# Patient Record
Sex: Male | Born: 1950
Health system: Southern US, Community
[De-identification: ages and names within clinical notes are randomized; demographics above are authoritative.]

## PROBLEM LIST (undated history)

## (undated) DIAGNOSIS — G4733 Obstructive sleep apnea (adult) (pediatric): Secondary | ICD-10-CM

## (undated) DIAGNOSIS — G43909 Migraine, unspecified, not intractable, without status migrainosus: Secondary | ICD-10-CM

## (undated) DIAGNOSIS — I10 Essential (primary) hypertension: Secondary | ICD-10-CM

## (undated) DIAGNOSIS — E119 Type 2 diabetes mellitus without complications: Secondary | ICD-10-CM

## (undated) HISTORY — PX: OTHER SURGICAL HISTORY: SHX169

## (undated) HISTORY — PX: MOHS SURGERY: SUR867

## (undated) HISTORY — PX: CATARACT EXTRACTION: SUR2

## (undated) HISTORY — PX: TONSILLECTOMY: SUR1361

## (undated) HISTORY — DX: Essential (primary) hypertension: I10

## (undated) HISTORY — DX: Migraine, unspecified, not intractable, without status migrainosus: G43.909

## (undated) HISTORY — DX: Type 2 diabetes mellitus without complications: E11.9

## (undated) HISTORY — DX: Obstructive sleep apnea (adult) (pediatric): G47.33

---

## 2016-06-13 DIAGNOSIS — I10 Essential (primary) hypertension: Secondary | ICD-10-CM | POA: Diagnosis not present

## 2016-06-13 DIAGNOSIS — D649 Anemia, unspecified: Secondary | ICD-10-CM | POA: Diagnosis not present

## 2016-06-18 DIAGNOSIS — E559 Vitamin D deficiency, unspecified: Secondary | ICD-10-CM | POA: Diagnosis not present

## 2016-06-20 DIAGNOSIS — R809 Proteinuria, unspecified: Secondary | ICD-10-CM | POA: Diagnosis not present

## 2016-06-20 DIAGNOSIS — E119 Type 2 diabetes mellitus without complications: Secondary | ICD-10-CM | POA: Diagnosis not present

## 2016-06-20 DIAGNOSIS — D649 Anemia, unspecified: Secondary | ICD-10-CM | POA: Diagnosis not present

## 2016-06-20 DIAGNOSIS — I10 Essential (primary) hypertension: Secondary | ICD-10-CM | POA: Diagnosis not present

## 2016-06-20 DIAGNOSIS — E669 Obesity, unspecified: Secondary | ICD-10-CM | POA: Diagnosis not present

## 2016-06-20 DIAGNOSIS — G4733 Obstructive sleep apnea (adult) (pediatric): Secondary | ICD-10-CM | POA: Diagnosis not present

## 2016-07-06 DIAGNOSIS — G4733 Obstructive sleep apnea (adult) (pediatric): Secondary | ICD-10-CM | POA: Diagnosis not present

## 2016-09-24 DIAGNOSIS — M109 Gout, unspecified: Secondary | ICD-10-CM | POA: Diagnosis not present

## 2016-09-24 DIAGNOSIS — E7800 Pure hypercholesterolemia, unspecified: Secondary | ICD-10-CM | POA: Diagnosis not present

## 2016-09-24 DIAGNOSIS — E559 Vitamin D deficiency, unspecified: Secondary | ICD-10-CM | POA: Diagnosis not present

## 2016-09-24 DIAGNOSIS — J029 Acute pharyngitis, unspecified: Secondary | ICD-10-CM | POA: Diagnosis not present

## 2016-09-24 DIAGNOSIS — E1165 Type 2 diabetes mellitus with hyperglycemia: Secondary | ICD-10-CM | POA: Diagnosis not present

## 2016-10-01 DIAGNOSIS — E119 Type 2 diabetes mellitus without complications: Secondary | ICD-10-CM | POA: Diagnosis not present

## 2016-10-01 DIAGNOSIS — G4730 Sleep apnea, unspecified: Secondary | ICD-10-CM | POA: Diagnosis not present

## 2016-10-01 DIAGNOSIS — E669 Obesity, unspecified: Secondary | ICD-10-CM | POA: Diagnosis not present

## 2016-10-01 DIAGNOSIS — K219 Gastro-esophageal reflux disease without esophagitis: Secondary | ICD-10-CM | POA: Diagnosis not present

## 2016-10-01 DIAGNOSIS — J309 Allergic rhinitis, unspecified: Secondary | ICD-10-CM | POA: Diagnosis not present

## 2016-10-01 DIAGNOSIS — Z9989 Dependence on other enabling machines and devices: Secondary | ICD-10-CM | POA: Diagnosis not present

## 2016-10-01 DIAGNOSIS — Z791 Long term (current) use of non-steroidal anti-inflammatories (NSAID): Secondary | ICD-10-CM | POA: Diagnosis not present

## 2016-10-01 DIAGNOSIS — Z7982 Long term (current) use of aspirin: Secondary | ICD-10-CM | POA: Diagnosis not present

## 2016-10-01 DIAGNOSIS — Z0000 Encounter for general adult medical examination without abnormal findings: Secondary | ICD-10-CM | POA: Diagnosis not present

## 2016-10-01 DIAGNOSIS — I10 Essential (primary) hypertension: Secondary | ICD-10-CM | POA: Diagnosis not present

## 2016-10-01 DIAGNOSIS — Z6839 Body mass index (BMI) 39.0-39.9, adult: Secondary | ICD-10-CM | POA: Diagnosis not present

## 2016-10-04 DIAGNOSIS — M65332 Trigger finger, left middle finger: Secondary | ICD-10-CM | POA: Diagnosis not present

## 2016-10-08 DIAGNOSIS — R69 Illness, unspecified: Secondary | ICD-10-CM | POA: Diagnosis not present

## 2016-10-22 DIAGNOSIS — L738 Other specified follicular disorders: Secondary | ICD-10-CM | POA: Diagnosis not present

## 2016-10-22 DIAGNOSIS — L570 Actinic keratosis: Secondary | ICD-10-CM | POA: Diagnosis not present

## 2016-10-24 DIAGNOSIS — Z800 Family history of malignant neoplasm of digestive organs: Secondary | ICD-10-CM | POA: Diagnosis not present

## 2016-10-24 DIAGNOSIS — Z86010 Personal history of colonic polyps: Secondary | ICD-10-CM | POA: Diagnosis not present

## 2016-11-07 DIAGNOSIS — R69 Illness, unspecified: Secondary | ICD-10-CM | POA: Diagnosis not present

## 2016-11-08 DIAGNOSIS — M65332 Trigger finger, left middle finger: Secondary | ICD-10-CM | POA: Diagnosis not present

## 2016-11-16 DIAGNOSIS — Z86010 Personal history of colonic polyps: Secondary | ICD-10-CM | POA: Diagnosis not present

## 2016-12-31 DIAGNOSIS — Z0101 Encounter for examination of eyes and vision with abnormal findings: Secondary | ICD-10-CM | POA: Diagnosis not present

## 2016-12-31 DIAGNOSIS — E119 Type 2 diabetes mellitus without complications: Secondary | ICD-10-CM | POA: Diagnosis not present

## 2016-12-31 DIAGNOSIS — H43819 Vitreous degeneration, unspecified eye: Secondary | ICD-10-CM | POA: Diagnosis not present

## 2017-01-03 DIAGNOSIS — D649 Anemia, unspecified: Secondary | ICD-10-CM | POA: Diagnosis not present

## 2017-01-03 DIAGNOSIS — I10 Essential (primary) hypertension: Secondary | ICD-10-CM | POA: Diagnosis not present

## 2017-01-04 DIAGNOSIS — Z23 Encounter for immunization: Secondary | ICD-10-CM | POA: Diagnosis not present

## 2017-01-04 DIAGNOSIS — J45901 Unspecified asthma with (acute) exacerbation: Secondary | ICD-10-CM | POA: Diagnosis not present

## 2017-01-04 DIAGNOSIS — I10 Essential (primary) hypertension: Secondary | ICD-10-CM | POA: Diagnosis not present

## 2017-01-04 DIAGNOSIS — E559 Vitamin D deficiency, unspecified: Secondary | ICD-10-CM | POA: Diagnosis not present

## 2017-01-04 DIAGNOSIS — G4733 Obstructive sleep apnea (adult) (pediatric): Secondary | ICD-10-CM | POA: Diagnosis not present

## 2017-01-04 DIAGNOSIS — E119 Type 2 diabetes mellitus without complications: Secondary | ICD-10-CM | POA: Diagnosis not present

## 2017-01-10 DIAGNOSIS — E1165 Type 2 diabetes mellitus with hyperglycemia: Secondary | ICD-10-CM | POA: Diagnosis not present

## 2017-01-18 DIAGNOSIS — E1165 Type 2 diabetes mellitus with hyperglycemia: Secondary | ICD-10-CM | POA: Diagnosis not present

## 2017-01-18 DIAGNOSIS — I10 Essential (primary) hypertension: Secondary | ICD-10-CM | POA: Diagnosis not present

## 2017-01-18 DIAGNOSIS — Z6838 Body mass index (BMI) 38.0-38.9, adult: Secondary | ICD-10-CM | POA: Diagnosis not present

## 2017-01-31 DIAGNOSIS — E1165 Type 2 diabetes mellitus with hyperglycemia: Secondary | ICD-10-CM | POA: Diagnosis not present

## 2017-01-31 DIAGNOSIS — R69 Illness, unspecified: Secondary | ICD-10-CM | POA: Diagnosis not present

## 2017-01-31 DIAGNOSIS — Z6838 Body mass index (BMI) 38.0-38.9, adult: Secondary | ICD-10-CM | POA: Diagnosis not present

## 2017-01-31 DIAGNOSIS — I10 Essential (primary) hypertension: Secondary | ICD-10-CM | POA: Diagnosis not present

## 2017-03-04 DIAGNOSIS — R69 Illness, unspecified: Secondary | ICD-10-CM | POA: Diagnosis not present

## 2017-03-11 DIAGNOSIS — I10 Essential (primary) hypertension: Secondary | ICD-10-CM | POA: Diagnosis not present

## 2017-03-11 DIAGNOSIS — D649 Anemia, unspecified: Secondary | ICD-10-CM | POA: Diagnosis not present

## 2017-03-14 DIAGNOSIS — E782 Mixed hyperlipidemia: Secondary | ICD-10-CM | POA: Diagnosis not present

## 2017-03-14 DIAGNOSIS — Z23 Encounter for immunization: Secondary | ICD-10-CM | POA: Diagnosis not present

## 2017-03-14 DIAGNOSIS — N289 Disorder of kidney and ureter, unspecified: Secondary | ICD-10-CM | POA: Diagnosis not present

## 2017-03-14 DIAGNOSIS — I10 Essential (primary) hypertension: Secondary | ICD-10-CM | POA: Diagnosis not present

## 2017-03-14 DIAGNOSIS — E1165 Type 2 diabetes mellitus with hyperglycemia: Secondary | ICD-10-CM | POA: Diagnosis not present

## 2017-04-03 DIAGNOSIS — B351 Tinea unguium: Secondary | ICD-10-CM | POA: Diagnosis not present

## 2017-04-03 DIAGNOSIS — M79671 Pain in right foot: Secondary | ICD-10-CM | POA: Diagnosis not present

## 2017-04-03 DIAGNOSIS — E119 Type 2 diabetes mellitus without complications: Secondary | ICD-10-CM | POA: Diagnosis not present

## 2017-04-07 DIAGNOSIS — R69 Illness, unspecified: Secondary | ICD-10-CM | POA: Diagnosis not present

## 2017-04-17 DIAGNOSIS — I10 Essential (primary) hypertension: Secondary | ICD-10-CM | POA: Diagnosis not present

## 2017-04-17 DIAGNOSIS — Z6837 Body mass index (BMI) 37.0-37.9, adult: Secondary | ICD-10-CM | POA: Diagnosis not present

## 2017-04-17 DIAGNOSIS — E782 Mixed hyperlipidemia: Secondary | ICD-10-CM | POA: Diagnosis not present

## 2017-04-17 DIAGNOSIS — E1165 Type 2 diabetes mellitus with hyperglycemia: Secondary | ICD-10-CM | POA: Diagnosis not present

## 2017-04-18 DIAGNOSIS — I10 Essential (primary) hypertension: Secondary | ICD-10-CM | POA: Diagnosis not present

## 2017-04-18 DIAGNOSIS — E669 Obesity, unspecified: Secondary | ICD-10-CM | POA: Diagnosis not present

## 2017-04-18 DIAGNOSIS — E119 Type 2 diabetes mellitus without complications: Secondary | ICD-10-CM | POA: Diagnosis not present

## 2017-04-18 DIAGNOSIS — D649 Anemia, unspecified: Secondary | ICD-10-CM | POA: Diagnosis not present

## 2017-04-18 DIAGNOSIS — G4733 Obstructive sleep apnea (adult) (pediatric): Secondary | ICD-10-CM | POA: Diagnosis not present

## 2017-04-18 DIAGNOSIS — R809 Proteinuria, unspecified: Secondary | ICD-10-CM | POA: Diagnosis not present

## 2017-04-29 DIAGNOSIS — L400 Psoriasis vulgaris: Secondary | ICD-10-CM | POA: Diagnosis not present

## 2017-04-29 DIAGNOSIS — Z08 Encounter for follow-up examination after completed treatment for malignant neoplasm: Secondary | ICD-10-CM | POA: Diagnosis not present

## 2017-04-29 DIAGNOSIS — Z85828 Personal history of other malignant neoplasm of skin: Secondary | ICD-10-CM | POA: Diagnosis not present

## 2017-04-29 DIAGNOSIS — L570 Actinic keratosis: Secondary | ICD-10-CM | POA: Diagnosis not present

## 2017-04-29 DIAGNOSIS — C4441 Basal cell carcinoma of skin of scalp and neck: Secondary | ICD-10-CM | POA: Diagnosis not present

## 2017-04-29 DIAGNOSIS — D485 Neoplasm of uncertain behavior of skin: Secondary | ICD-10-CM | POA: Diagnosis not present

## 2017-05-15 DIAGNOSIS — R69 Illness, unspecified: Secondary | ICD-10-CM | POA: Diagnosis not present

## 2017-05-17 DIAGNOSIS — R69 Illness, unspecified: Secondary | ICD-10-CM | POA: Diagnosis not present

## 2017-06-28 DIAGNOSIS — D2262 Melanocytic nevi of left upper limb, including shoulder: Secondary | ICD-10-CM | POA: Diagnosis not present

## 2017-06-28 DIAGNOSIS — Z08 Encounter for follow-up examination after completed treatment for malignant neoplasm: Secondary | ICD-10-CM | POA: Diagnosis not present

## 2017-06-28 DIAGNOSIS — Z85828 Personal history of other malignant neoplasm of skin: Secondary | ICD-10-CM | POA: Diagnosis not present

## 2017-06-28 DIAGNOSIS — C4441 Basal cell carcinoma of skin of scalp and neck: Secondary | ICD-10-CM | POA: Diagnosis not present

## 2017-07-02 DIAGNOSIS — R69 Illness, unspecified: Secondary | ICD-10-CM | POA: Diagnosis not present

## 2017-07-04 DIAGNOSIS — R69 Illness, unspecified: Secondary | ICD-10-CM | POA: Diagnosis not present

## 2017-07-08 DIAGNOSIS — G4733 Obstructive sleep apnea (adult) (pediatric): Secondary | ICD-10-CM | POA: Diagnosis not present

## 2017-07-24 DIAGNOSIS — Z6837 Body mass index (BMI) 37.0-37.9, adult: Secondary | ICD-10-CM | POA: Diagnosis not present

## 2017-07-24 DIAGNOSIS — E1165 Type 2 diabetes mellitus with hyperglycemia: Secondary | ICD-10-CM | POA: Diagnosis not present

## 2017-07-24 DIAGNOSIS — I10 Essential (primary) hypertension: Secondary | ICD-10-CM | POA: Diagnosis not present

## 2017-07-24 DIAGNOSIS — E782 Mixed hyperlipidemia: Secondary | ICD-10-CM | POA: Diagnosis not present

## 2017-10-03 DIAGNOSIS — R69 Illness, unspecified: Secondary | ICD-10-CM | POA: Diagnosis not present

## 2017-10-22 DIAGNOSIS — N289 Disorder of kidney and ureter, unspecified: Secondary | ICD-10-CM | POA: Diagnosis not present

## 2017-10-22 DIAGNOSIS — I10 Essential (primary) hypertension: Secondary | ICD-10-CM | POA: Diagnosis not present

## 2017-10-22 DIAGNOSIS — E782 Mixed hyperlipidemia: Secondary | ICD-10-CM | POA: Diagnosis not present

## 2017-10-22 DIAGNOSIS — E1165 Type 2 diabetes mellitus with hyperglycemia: Secondary | ICD-10-CM | POA: Diagnosis not present

## 2017-10-24 DIAGNOSIS — E782 Mixed hyperlipidemia: Secondary | ICD-10-CM | POA: Diagnosis not present

## 2017-10-24 DIAGNOSIS — Z6838 Body mass index (BMI) 38.0-38.9, adult: Secondary | ICD-10-CM | POA: Diagnosis not present

## 2017-10-24 DIAGNOSIS — E1165 Type 2 diabetes mellitus with hyperglycemia: Secondary | ICD-10-CM | POA: Diagnosis not present

## 2017-10-24 DIAGNOSIS — I10 Essential (primary) hypertension: Secondary | ICD-10-CM | POA: Diagnosis not present

## 2017-10-24 DIAGNOSIS — M549 Dorsalgia, unspecified: Secondary | ICD-10-CM | POA: Diagnosis not present

## 2017-10-24 DIAGNOSIS — N289 Disorder of kidney and ureter, unspecified: Secondary | ICD-10-CM | POA: Diagnosis not present

## 2017-10-28 DIAGNOSIS — L821 Other seborrheic keratosis: Secondary | ICD-10-CM | POA: Diagnosis not present

## 2017-10-28 DIAGNOSIS — D1801 Hemangioma of skin and subcutaneous tissue: Secondary | ICD-10-CM | POA: Diagnosis not present

## 2017-10-28 DIAGNOSIS — Z08 Encounter for follow-up examination after completed treatment for malignant neoplasm: Secondary | ICD-10-CM | POA: Diagnosis not present

## 2017-10-28 DIAGNOSIS — L570 Actinic keratosis: Secondary | ICD-10-CM | POA: Diagnosis not present

## 2017-10-28 DIAGNOSIS — X32XXXS Exposure to sunlight, sequela: Secondary | ICD-10-CM | POA: Diagnosis not present

## 2017-10-28 DIAGNOSIS — Z85828 Personal history of other malignant neoplasm of skin: Secondary | ICD-10-CM | POA: Diagnosis not present

## 2017-10-28 DIAGNOSIS — L814 Other melanin hyperpigmentation: Secondary | ICD-10-CM | POA: Diagnosis not present

## 2017-11-20 DIAGNOSIS — R69 Illness, unspecified: Secondary | ICD-10-CM | POA: Diagnosis not present

## 2017-11-25 DIAGNOSIS — G4733 Obstructive sleep apnea (adult) (pediatric): Secondary | ICD-10-CM | POA: Diagnosis not present

## 2017-11-25 DIAGNOSIS — E1165 Type 2 diabetes mellitus with hyperglycemia: Secondary | ICD-10-CM | POA: Diagnosis not present

## 2017-11-25 DIAGNOSIS — Z6838 Body mass index (BMI) 38.0-38.9, adult: Secondary | ICD-10-CM | POA: Diagnosis not present

## 2018-01-06 DIAGNOSIS — G4733 Obstructive sleep apnea (adult) (pediatric): Secondary | ICD-10-CM | POA: Diagnosis not present

## 2018-01-21 DIAGNOSIS — E1165 Type 2 diabetes mellitus with hyperglycemia: Secondary | ICD-10-CM | POA: Diagnosis not present

## 2018-01-21 DIAGNOSIS — Z125 Encounter for screening for malignant neoplasm of prostate: Secondary | ICD-10-CM | POA: Diagnosis not present

## 2018-01-21 DIAGNOSIS — Z0000 Encounter for general adult medical examination without abnormal findings: Secondary | ICD-10-CM | POA: Diagnosis not present

## 2018-01-23 DIAGNOSIS — E1165 Type 2 diabetes mellitus with hyperglycemia: Secondary | ICD-10-CM | POA: Diagnosis not present

## 2018-01-23 DIAGNOSIS — E782 Mixed hyperlipidemia: Secondary | ICD-10-CM | POA: Diagnosis not present

## 2018-01-23 DIAGNOSIS — Z1211 Encounter for screening for malignant neoplasm of colon: Secondary | ICD-10-CM | POA: Diagnosis not present

## 2018-01-23 DIAGNOSIS — Z0000 Encounter for general adult medical examination without abnormal findings: Secondary | ICD-10-CM | POA: Diagnosis not present

## 2018-01-23 DIAGNOSIS — Z23 Encounter for immunization: Secondary | ICD-10-CM | POA: Diagnosis not present

## 2018-01-23 DIAGNOSIS — Z6838 Body mass index (BMI) 38.0-38.9, adult: Secondary | ICD-10-CM | POA: Diagnosis not present

## 2018-01-23 DIAGNOSIS — I10 Essential (primary) hypertension: Secondary | ICD-10-CM | POA: Diagnosis not present

## 2018-02-09 DIAGNOSIS — M109 Gout, unspecified: Secondary | ICD-10-CM | POA: Diagnosis not present

## 2018-02-11 DIAGNOSIS — E119 Type 2 diabetes mellitus without complications: Secondary | ICD-10-CM | POA: Diagnosis not present

## 2018-02-11 DIAGNOSIS — H43819 Vitreous degeneration, unspecified eye: Secondary | ICD-10-CM | POA: Diagnosis not present

## 2018-02-12 DIAGNOSIS — M1A0710 Idiopathic chronic gout, right ankle and foot, without tophus (tophi): Secondary | ICD-10-CM | POA: Diagnosis not present

## 2018-02-12 DIAGNOSIS — E119 Type 2 diabetes mellitus without complications: Secondary | ICD-10-CM | POA: Diagnosis not present

## 2018-02-12 DIAGNOSIS — M79672 Pain in left foot: Secondary | ICD-10-CM | POA: Diagnosis not present

## 2018-02-12 DIAGNOSIS — M79671 Pain in right foot: Secondary | ICD-10-CM | POA: Diagnosis not present

## 2018-02-12 DIAGNOSIS — B351 Tinea unguium: Secondary | ICD-10-CM | POA: Diagnosis not present

## 2018-02-17 DIAGNOSIS — R69 Illness, unspecified: Secondary | ICD-10-CM | POA: Diagnosis not present

## 2018-04-10 DIAGNOSIS — M7051 Other bursitis of knee, right knee: Secondary | ICD-10-CM | POA: Diagnosis not present

## 2018-04-23 DIAGNOSIS — Z85828 Personal history of other malignant neoplasm of skin: Secondary | ICD-10-CM | POA: Diagnosis not present

## 2018-04-23 DIAGNOSIS — C4441 Basal cell carcinoma of skin of scalp and neck: Secondary | ICD-10-CM | POA: Diagnosis not present

## 2018-04-23 DIAGNOSIS — D485 Neoplasm of uncertain behavior of skin: Secondary | ICD-10-CM | POA: Diagnosis not present

## 2018-04-23 DIAGNOSIS — L821 Other seborrheic keratosis: Secondary | ICD-10-CM | POA: Diagnosis not present

## 2018-04-24 DIAGNOSIS — M7051 Other bursitis of knee, right knee: Secondary | ICD-10-CM | POA: Diagnosis not present

## 2018-05-14 DIAGNOSIS — M79672 Pain in left foot: Secondary | ICD-10-CM | POA: Diagnosis not present

## 2018-05-14 DIAGNOSIS — B351 Tinea unguium: Secondary | ICD-10-CM | POA: Diagnosis not present

## 2018-05-14 DIAGNOSIS — Z5181 Encounter for therapeutic drug level monitoring: Secondary | ICD-10-CM | POA: Diagnosis not present

## 2018-05-14 DIAGNOSIS — M79671 Pain in right foot: Secondary | ICD-10-CM | POA: Diagnosis not present

## 2018-05-15 DIAGNOSIS — M7051 Other bursitis of knee, right knee: Secondary | ICD-10-CM | POA: Diagnosis not present

## 2018-05-17 DIAGNOSIS — J019 Acute sinusitis, unspecified: Secondary | ICD-10-CM | POA: Diagnosis not present

## 2018-05-19 DIAGNOSIS — R69 Illness, unspecified: Secondary | ICD-10-CM | POA: Diagnosis not present

## 2018-05-21 DIAGNOSIS — R69 Illness, unspecified: Secondary | ICD-10-CM | POA: Diagnosis not present

## 2018-05-26 DIAGNOSIS — E782 Mixed hyperlipidemia: Secondary | ICD-10-CM | POA: Diagnosis not present

## 2018-05-26 DIAGNOSIS — I10 Essential (primary) hypertension: Secondary | ICD-10-CM | POA: Diagnosis not present

## 2018-05-26 DIAGNOSIS — E559 Vitamin D deficiency, unspecified: Secondary | ICD-10-CM | POA: Diagnosis not present

## 2018-05-26 DIAGNOSIS — E1165 Type 2 diabetes mellitus with hyperglycemia: Secondary | ICD-10-CM | POA: Diagnosis not present

## 2018-05-27 DIAGNOSIS — E782 Mixed hyperlipidemia: Secondary | ICD-10-CM | POA: Diagnosis not present

## 2018-05-27 DIAGNOSIS — I10 Essential (primary) hypertension: Secondary | ICD-10-CM | POA: Diagnosis not present

## 2018-05-27 DIAGNOSIS — Z6838 Body mass index (BMI) 38.0-38.9, adult: Secondary | ICD-10-CM | POA: Diagnosis not present

## 2018-05-27 DIAGNOSIS — E559 Vitamin D deficiency, unspecified: Secondary | ICD-10-CM | POA: Diagnosis not present

## 2018-05-27 DIAGNOSIS — M109 Gout, unspecified: Secondary | ICD-10-CM | POA: Diagnosis not present

## 2018-05-27 DIAGNOSIS — E1165 Type 2 diabetes mellitus with hyperglycemia: Secondary | ICD-10-CM | POA: Diagnosis not present

## 2018-06-09 DIAGNOSIS — R69 Illness, unspecified: Secondary | ICD-10-CM | POA: Diagnosis not present

## 2018-07-08 DIAGNOSIS — E1165 Type 2 diabetes mellitus with hyperglycemia: Secondary | ICD-10-CM | POA: Diagnosis not present

## 2018-07-08 DIAGNOSIS — M109 Gout, unspecified: Secondary | ICD-10-CM | POA: Diagnosis not present

## 2018-07-08 DIAGNOSIS — Z6838 Body mass index (BMI) 38.0-38.9, adult: Secondary | ICD-10-CM | POA: Diagnosis not present

## 2018-07-08 DIAGNOSIS — I10 Essential (primary) hypertension: Secondary | ICD-10-CM | POA: Diagnosis not present

## 2018-07-08 DIAGNOSIS — E782 Mixed hyperlipidemia: Secondary | ICD-10-CM | POA: Diagnosis not present

## 2018-07-09 DIAGNOSIS — G4733 Obstructive sleep apnea (adult) (pediatric): Secondary | ICD-10-CM | POA: Diagnosis not present

## 2018-07-14 DIAGNOSIS — R69 Illness, unspecified: Secondary | ICD-10-CM | POA: Diagnosis not present

## 2018-07-30 DIAGNOSIS — C4441 Basal cell carcinoma of skin of scalp and neck: Secondary | ICD-10-CM | POA: Diagnosis not present

## 2018-09-02 DIAGNOSIS — I10 Essential (primary) hypertension: Secondary | ICD-10-CM | POA: Diagnosis not present

## 2018-09-02 DIAGNOSIS — E1165 Type 2 diabetes mellitus with hyperglycemia: Secondary | ICD-10-CM | POA: Diagnosis not present

## 2018-09-02 DIAGNOSIS — G4733 Obstructive sleep apnea (adult) (pediatric): Secondary | ICD-10-CM | POA: Diagnosis not present

## 2018-10-15 DIAGNOSIS — Z85828 Personal history of other malignant neoplasm of skin: Secondary | ICD-10-CM | POA: Diagnosis not present

## 2018-10-15 DIAGNOSIS — Z08 Encounter for follow-up examination after completed treatment for malignant neoplasm: Secondary | ICD-10-CM | POA: Diagnosis not present

## 2018-10-21 DIAGNOSIS — R69 Illness, unspecified: Secondary | ICD-10-CM | POA: Diagnosis not present

## 2018-10-31 DIAGNOSIS — E119 Type 2 diabetes mellitus without complications: Secondary | ICD-10-CM | POA: Diagnosis not present

## 2018-11-04 DIAGNOSIS — I10 Essential (primary) hypertension: Secondary | ICD-10-CM | POA: Diagnosis not present

## 2018-11-04 DIAGNOSIS — E1165 Type 2 diabetes mellitus with hyperglycemia: Secondary | ICD-10-CM | POA: Diagnosis not present

## 2018-11-04 DIAGNOSIS — J309 Allergic rhinitis, unspecified: Secondary | ICD-10-CM | POA: Diagnosis not present

## 2018-11-05 DIAGNOSIS — B351 Tinea unguium: Secondary | ICD-10-CM | POA: Diagnosis not present

## 2018-11-05 DIAGNOSIS — M79672 Pain in left foot: Secondary | ICD-10-CM | POA: Diagnosis not present

## 2018-11-05 DIAGNOSIS — M79671 Pain in right foot: Secondary | ICD-10-CM | POA: Diagnosis not present

## 2019-01-01 DIAGNOSIS — D3132 Benign neoplasm of left choroid: Secondary | ICD-10-CM | POA: Diagnosis not present

## 2019-01-01 DIAGNOSIS — G43801 Other migraine, not intractable, with status migrainosus: Secondary | ICD-10-CM | POA: Diagnosis not present

## 2019-01-07 DIAGNOSIS — G4733 Obstructive sleep apnea (adult) (pediatric): Secondary | ICD-10-CM | POA: Diagnosis not present

## 2019-01-17 DIAGNOSIS — R69 Illness, unspecified: Secondary | ICD-10-CM | POA: Diagnosis not present

## 2019-01-29 DIAGNOSIS — E782 Mixed hyperlipidemia: Secondary | ICD-10-CM | POA: Diagnosis not present

## 2019-01-29 DIAGNOSIS — M109 Gout, unspecified: Secondary | ICD-10-CM | POA: Diagnosis not present

## 2019-01-29 DIAGNOSIS — I10 Essential (primary) hypertension: Secondary | ICD-10-CM | POA: Diagnosis not present

## 2019-01-29 DIAGNOSIS — E119 Type 2 diabetes mellitus without complications: Secondary | ICD-10-CM | POA: Diagnosis not present

## 2019-02-02 DIAGNOSIS — J309 Allergic rhinitis, unspecified: Secondary | ICD-10-CM | POA: Diagnosis not present

## 2019-02-02 DIAGNOSIS — E1165 Type 2 diabetes mellitus with hyperglycemia: Secondary | ICD-10-CM | POA: Diagnosis not present

## 2019-02-02 DIAGNOSIS — E782 Mixed hyperlipidemia: Secondary | ICD-10-CM | POA: Diagnosis not present

## 2019-02-02 DIAGNOSIS — M109 Gout, unspecified: Secondary | ICD-10-CM | POA: Diagnosis not present

## 2019-02-02 DIAGNOSIS — I10 Essential (primary) hypertension: Secondary | ICD-10-CM | POA: Diagnosis not present

## 2019-02-04 DIAGNOSIS — M79671 Pain in right foot: Secondary | ICD-10-CM | POA: Diagnosis not present

## 2019-02-04 DIAGNOSIS — M79672 Pain in left foot: Secondary | ICD-10-CM | POA: Diagnosis not present

## 2019-02-04 DIAGNOSIS — B351 Tinea unguium: Secondary | ICD-10-CM | POA: Diagnosis not present

## 2019-02-05 DIAGNOSIS — Z23 Encounter for immunization: Secondary | ICD-10-CM | POA: Diagnosis not present

## 2019-02-09 DIAGNOSIS — M79671 Pain in right foot: Secondary | ICD-10-CM | POA: Diagnosis not present

## 2019-02-09 DIAGNOSIS — M79672 Pain in left foot: Secondary | ICD-10-CM | POA: Diagnosis not present

## 2019-02-09 DIAGNOSIS — B351 Tinea unguium: Secondary | ICD-10-CM | POA: Diagnosis not present

## 2019-02-19 DIAGNOSIS — G43801 Other migraine, not intractable, with status migrainosus: Secondary | ICD-10-CM | POA: Diagnosis not present

## 2019-02-19 DIAGNOSIS — E1165 Type 2 diabetes mellitus with hyperglycemia: Secondary | ICD-10-CM | POA: Diagnosis not present

## 2019-02-19 DIAGNOSIS — D3132 Benign neoplasm of left choroid: Secondary | ICD-10-CM | POA: Diagnosis not present

## 2019-02-23 DIAGNOSIS — L821 Other seborrheic keratosis: Secondary | ICD-10-CM | POA: Diagnosis not present

## 2019-02-23 DIAGNOSIS — D225 Melanocytic nevi of trunk: Secondary | ICD-10-CM | POA: Diagnosis not present

## 2019-02-23 DIAGNOSIS — L570 Actinic keratosis: Secondary | ICD-10-CM | POA: Diagnosis not present

## 2019-02-23 DIAGNOSIS — D485 Neoplasm of uncertain behavior of skin: Secondary | ICD-10-CM | POA: Diagnosis not present

## 2019-02-23 DIAGNOSIS — Z85828 Personal history of other malignant neoplasm of skin: Secondary | ICD-10-CM | POA: Diagnosis not present

## 2019-02-23 DIAGNOSIS — L408 Other psoriasis: Secondary | ICD-10-CM | POA: Diagnosis not present

## 2019-04-28 DIAGNOSIS — Z1159 Encounter for screening for other viral diseases: Secondary | ICD-10-CM | POA: Diagnosis not present

## 2019-05-01 DIAGNOSIS — Z125 Encounter for screening for malignant neoplasm of prostate: Secondary | ICD-10-CM | POA: Diagnosis not present

## 2019-05-01 DIAGNOSIS — Z0000 Encounter for general adult medical examination without abnormal findings: Secondary | ICD-10-CM | POA: Diagnosis not present

## 2019-05-06 DIAGNOSIS — Z0000 Encounter for general adult medical examination without abnormal findings: Secondary | ICD-10-CM | POA: Diagnosis not present

## 2019-05-06 DIAGNOSIS — B351 Tinea unguium: Secondary | ICD-10-CM | POA: Diagnosis not present

## 2019-05-06 DIAGNOSIS — M79672 Pain in left foot: Secondary | ICD-10-CM | POA: Diagnosis not present

## 2019-05-06 DIAGNOSIS — E119 Type 2 diabetes mellitus without complications: Secondary | ICD-10-CM | POA: Diagnosis not present

## 2019-05-06 DIAGNOSIS — M79671 Pain in right foot: Secondary | ICD-10-CM | POA: Diagnosis not present

## 2019-05-06 DIAGNOSIS — Z6838 Body mass index (BMI) 38.0-38.9, adult: Secondary | ICD-10-CM | POA: Diagnosis not present

## 2019-06-01 DIAGNOSIS — S83241A Other tear of medial meniscus, current injury, right knee, initial encounter: Secondary | ICD-10-CM | POA: Diagnosis not present

## 2019-06-01 DIAGNOSIS — M25561 Pain in right knee: Secondary | ICD-10-CM | POA: Diagnosis not present

## 2019-06-01 DIAGNOSIS — M67911 Unspecified disorder of synovium and tendon, right shoulder: Secondary | ICD-10-CM | POA: Diagnosis not present

## 2019-06-02 DIAGNOSIS — R69 Illness, unspecified: Secondary | ICD-10-CM | POA: Diagnosis not present

## 2019-06-02 DIAGNOSIS — N289 Disorder of kidney and ureter, unspecified: Secondary | ICD-10-CM | POA: Diagnosis not present

## 2019-06-02 DIAGNOSIS — E1165 Type 2 diabetes mellitus with hyperglycemia: Secondary | ICD-10-CM | POA: Diagnosis not present

## 2019-06-02 DIAGNOSIS — R739 Hyperglycemia, unspecified: Secondary | ICD-10-CM | POA: Diagnosis not present

## 2019-06-04 DIAGNOSIS — R69 Illness, unspecified: Secondary | ICD-10-CM | POA: Diagnosis not present

## 2019-06-10 DIAGNOSIS — D485 Neoplasm of uncertain behavior of skin: Secondary | ICD-10-CM | POA: Diagnosis not present

## 2019-06-10 DIAGNOSIS — D229 Melanocytic nevi, unspecified: Secondary | ICD-10-CM | POA: Diagnosis not present

## 2019-06-10 DIAGNOSIS — Z85828 Personal history of other malignant neoplasm of skin: Secondary | ICD-10-CM | POA: Diagnosis not present

## 2019-06-10 DIAGNOSIS — C4441 Basal cell carcinoma of skin of scalp and neck: Secondary | ICD-10-CM | POA: Diagnosis not present

## 2019-06-10 DIAGNOSIS — L821 Other seborrheic keratosis: Secondary | ICD-10-CM | POA: Diagnosis not present

## 2019-06-16 DIAGNOSIS — E1165 Type 2 diabetes mellitus with hyperglycemia: Secondary | ICD-10-CM | POA: Diagnosis not present

## 2019-06-16 DIAGNOSIS — M170 Bilateral primary osteoarthritis of knee: Secondary | ICD-10-CM | POA: Diagnosis not present

## 2019-06-16 DIAGNOSIS — I10 Essential (primary) hypertension: Secondary | ICD-10-CM | POA: Diagnosis not present

## 2019-07-10 DIAGNOSIS — G4733 Obstructive sleep apnea (adult) (pediatric): Secondary | ICD-10-CM | POA: Diagnosis not present

## 2019-07-13 DIAGNOSIS — L989 Disorder of the skin and subcutaneous tissue, unspecified: Secondary | ICD-10-CM | POA: Diagnosis not present

## 2019-07-13 DIAGNOSIS — D485 Neoplasm of uncertain behavior of skin: Secondary | ICD-10-CM | POA: Diagnosis not present

## 2019-07-21 DIAGNOSIS — I10 Essential (primary) hypertension: Secondary | ICD-10-CM | POA: Diagnosis not present

## 2019-07-21 DIAGNOSIS — E782 Mixed hyperlipidemia: Secondary | ICD-10-CM | POA: Diagnosis not present

## 2019-07-21 DIAGNOSIS — E119 Type 2 diabetes mellitus without complications: Secondary | ICD-10-CM | POA: Diagnosis not present

## 2019-07-21 DIAGNOSIS — M109 Gout, unspecified: Secondary | ICD-10-CM | POA: Diagnosis not present

## 2019-07-22 DIAGNOSIS — E1165 Type 2 diabetes mellitus with hyperglycemia: Secondary | ICD-10-CM | POA: Diagnosis not present

## 2019-07-22 DIAGNOSIS — E782 Mixed hyperlipidemia: Secondary | ICD-10-CM | POA: Diagnosis not present

## 2019-07-22 DIAGNOSIS — I10 Essential (primary) hypertension: Secondary | ICD-10-CM | POA: Diagnosis not present

## 2019-08-04 DIAGNOSIS — M25462 Effusion, left knee: Secondary | ICD-10-CM | POA: Diagnosis not present

## 2019-08-04 DIAGNOSIS — M1712 Unilateral primary osteoarthritis, left knee: Secondary | ICD-10-CM | POA: Diagnosis not present

## 2019-08-04 DIAGNOSIS — M25562 Pain in left knee: Secondary | ICD-10-CM | POA: Diagnosis not present

## 2019-08-05 DIAGNOSIS — B351 Tinea unguium: Secondary | ICD-10-CM | POA: Diagnosis not present

## 2019-08-05 DIAGNOSIS — E119 Type 2 diabetes mellitus without complications: Secondary | ICD-10-CM | POA: Diagnosis not present

## 2019-08-05 DIAGNOSIS — M79671 Pain in right foot: Secondary | ICD-10-CM | POA: Diagnosis not present

## 2019-08-05 DIAGNOSIS — M79672 Pain in left foot: Secondary | ICD-10-CM | POA: Diagnosis not present

## 2019-08-12 DIAGNOSIS — I10 Essential (primary) hypertension: Secondary | ICD-10-CM | POA: Diagnosis not present

## 2019-08-12 DIAGNOSIS — E1165 Type 2 diabetes mellitus with hyperglycemia: Secondary | ICD-10-CM | POA: Diagnosis not present

## 2019-08-20 DIAGNOSIS — M25562 Pain in left knee: Secondary | ICD-10-CM | POA: Diagnosis not present

## 2019-08-28 ENCOUNTER — Other Ambulatory Visit: Payer: Self-pay | Admitting: General Practice

## 2019-08-28 NOTE — Patient Outreach (Signed)
Client is newly enrolled in the Special Needs Plan program with Type II Diabetes. Recent Hgb A1C is 6.9 % at goal. No Health Risk Assessment on file, Individualized Care Plan (ICP) completed from information in the EMR. Will send introductory letter with ICP to the primary provider and client, along with educational materials. No ED or acute inpatient visits in the past year, assigned RN Care Coordinator will follow up in 3 months.

## 2019-09-10 DIAGNOSIS — M25562 Pain in left knee: Secondary | ICD-10-CM | POA: Diagnosis not present

## 2019-09-16 DIAGNOSIS — C4441 Basal cell carcinoma of skin of scalp and neck: Secondary | ICD-10-CM | POA: Diagnosis not present

## 2019-09-21 DIAGNOSIS — L7621 Postprocedural hemorrhage and hematoma of skin and subcutaneous tissue following a dermatologic procedure: Secondary | ICD-10-CM | POA: Diagnosis not present

## 2019-09-21 DIAGNOSIS — S0100XD Unspecified open wound of scalp, subsequent encounter: Secondary | ICD-10-CM | POA: Diagnosis not present

## 2019-09-22 DIAGNOSIS — R600 Localized edema: Secondary | ICD-10-CM | POA: Diagnosis not present

## 2019-09-22 DIAGNOSIS — M170 Bilateral primary osteoarthritis of knee: Secondary | ICD-10-CM | POA: Diagnosis not present

## 2019-09-22 DIAGNOSIS — E1165 Type 2 diabetes mellitus with hyperglycemia: Secondary | ICD-10-CM | POA: Diagnosis not present

## 2019-09-22 DIAGNOSIS — I10 Essential (primary) hypertension: Secondary | ICD-10-CM | POA: Diagnosis not present

## 2019-09-24 DIAGNOSIS — C4441 Basal cell carcinoma of skin of scalp and neck: Secondary | ICD-10-CM | POA: Diagnosis not present

## 2019-09-24 DIAGNOSIS — Z85828 Personal history of other malignant neoplasm of skin: Secondary | ICD-10-CM | POA: Diagnosis not present

## 2019-09-24 DIAGNOSIS — S0100XA Unspecified open wound of scalp, initial encounter: Secondary | ICD-10-CM | POA: Diagnosis not present

## 2019-09-28 DIAGNOSIS — R600 Localized edema: Secondary | ICD-10-CM | POA: Diagnosis not present

## 2019-09-28 DIAGNOSIS — I10 Essential (primary) hypertension: Secondary | ICD-10-CM | POA: Diagnosis not present

## 2019-09-30 DIAGNOSIS — E1165 Type 2 diabetes mellitus with hyperglycemia: Secondary | ICD-10-CM | POA: Diagnosis not present

## 2019-09-30 DIAGNOSIS — R600 Localized edema: Secondary | ICD-10-CM | POA: Diagnosis not present

## 2019-09-30 DIAGNOSIS — R739 Hyperglycemia, unspecified: Secondary | ICD-10-CM | POA: Diagnosis not present

## 2019-10-08 DIAGNOSIS — E119 Type 2 diabetes mellitus without complications: Secondary | ICD-10-CM | POA: Diagnosis not present

## 2019-10-12 DIAGNOSIS — E1165 Type 2 diabetes mellitus with hyperglycemia: Secondary | ICD-10-CM | POA: Diagnosis not present

## 2019-10-12 DIAGNOSIS — I10 Essential (primary) hypertension: Secondary | ICD-10-CM | POA: Diagnosis not present

## 2019-10-12 DIAGNOSIS — R600 Localized edema: Secondary | ICD-10-CM | POA: Diagnosis not present

## 2019-10-15 DIAGNOSIS — E1165 Type 2 diabetes mellitus with hyperglycemia: Secondary | ICD-10-CM | POA: Diagnosis not present

## 2019-10-15 DIAGNOSIS — D3132 Benign neoplasm of left choroid: Secondary | ICD-10-CM | POA: Diagnosis not present

## 2019-10-15 DIAGNOSIS — G43801 Other migraine, not intractable, with status migrainosus: Secondary | ICD-10-CM | POA: Diagnosis not present

## 2019-10-26 DIAGNOSIS — H26491 Other secondary cataract, right eye: Secondary | ICD-10-CM | POA: Diagnosis not present

## 2019-11-02 DIAGNOSIS — B351 Tinea unguium: Secondary | ICD-10-CM | POA: Diagnosis not present

## 2019-11-02 DIAGNOSIS — M79671 Pain in right foot: Secondary | ICD-10-CM | POA: Diagnosis not present

## 2019-11-02 DIAGNOSIS — M79672 Pain in left foot: Secondary | ICD-10-CM | POA: Diagnosis not present

## 2019-11-02 DIAGNOSIS — E119 Type 2 diabetes mellitus without complications: Secondary | ICD-10-CM | POA: Diagnosis not present

## 2019-11-03 ENCOUNTER — Other Ambulatory Visit: Payer: Self-pay | Admitting: *Deleted

## 2019-11-03 NOTE — Patient Outreach (Signed)
  [facility]) Care Management Chronic Special Needs Program    11/03/2019  Name: Glenn [name], DOB: 1951-02-19  MRN: [id number]   Glenn [name] is enrolled in a chronic special needs plan for Diabetes.  Outreach call to client for initial telephone assessment, no answer to telephone and no option to leave voicemail.  PLAN Outreach client within 2-3 weeks  Jacqlyn Larsen [facility], [place] Coordinator [phone]

## 2019-11-04 ENCOUNTER — Other Ambulatory Visit: Payer: Self-pay | Admitting: *Deleted

## 2019-11-04 NOTE — Patient Outreach (Signed)
  [facility]) Care Management Chronic Special Needs Program    11/04/2019  Name: Paladin Veillette, DOB: 07-01-1950  MRN: [id number]   Mr. Tarique Suderman is enrolled in a chronic special needs plan for Diabetes.  Outreach call to client for initial telephone assessment (2nd attempt).  No answer to telephone and no option to leave voicemail.  PLAN Outreach client within 2-3 weeks  Jacqlyn Larsen [facility], [place] Coordinator [phone]

## 2019-11-10 ENCOUNTER — Other Ambulatory Visit: Payer: Self-pay | Admitting: *Deleted

## 2019-11-10 NOTE — Patient Outreach (Signed)
  [facility]) Care Management Chronic Special Needs Program    11/10/2019  Name: Glenn [name], DOB: 03/08/1951  MRN: [id number]   Mr. Glenn [name] is enrolled in a chronic special needs plan for Diabetes.  Outreach call to client for initial telephone assessment/  3rd attempt, no answer to telephone and no option to leave voicemail.  RN care manager mailed unsuccessful outreach letter to client's home.  PLAN Outreach client in 3 months  Jacqlyn Larsen [facility], BSN [place], [place]

## 2019-11-12 DIAGNOSIS — L929 Granulomatous disorder of the skin and subcutaneous tissue, unspecified: Secondary | ICD-10-CM | POA: Diagnosis not present

## 2019-11-12 DIAGNOSIS — S0100XA Unspecified open wound of scalp, initial encounter: Secondary | ICD-10-CM | POA: Diagnosis not present

## 2019-11-23 DIAGNOSIS — L821 Other seborrheic keratosis: Secondary | ICD-10-CM | POA: Diagnosis not present

## 2019-11-23 DIAGNOSIS — D2372 Other benign neoplasm of skin of left lower limb, including hip: Secondary | ICD-10-CM | POA: Diagnosis not present

## 2019-11-23 DIAGNOSIS — Z85828 Personal history of other malignant neoplasm of skin: Secondary | ICD-10-CM | POA: Diagnosis not present

## 2019-11-23 DIAGNOSIS — D485 Neoplasm of uncertain behavior of skin: Secondary | ICD-10-CM | POA: Diagnosis not present

## 2019-11-23 DIAGNOSIS — L570 Actinic keratosis: Secondary | ICD-10-CM | POA: Diagnosis not present

## 2019-11-23 DIAGNOSIS — L578 Other skin changes due to chronic exposure to nonionizing radiation: Secondary | ICD-10-CM | POA: Diagnosis not present

## 2019-11-26 DIAGNOSIS — I10 Essential (primary) hypertension: Secondary | ICD-10-CM | POA: Diagnosis not present

## 2019-11-26 DIAGNOSIS — R600 Localized edema: Secondary | ICD-10-CM | POA: Diagnosis not present

## 2019-11-30 DIAGNOSIS — E669 Obesity, unspecified: Secondary | ICD-10-CM | POA: Diagnosis not present

## 2019-11-30 DIAGNOSIS — C4491 Basal cell carcinoma of skin, unspecified: Secondary | ICD-10-CM | POA: Diagnosis not present

## 2019-11-30 DIAGNOSIS — R600 Localized edema: Secondary | ICD-10-CM | POA: Diagnosis not present

## 2019-11-30 DIAGNOSIS — M109 Gout, unspecified: Secondary | ICD-10-CM | POA: Diagnosis not present

## 2019-11-30 DIAGNOSIS — I10 Essential (primary) hypertension: Secondary | ICD-10-CM | POA: Diagnosis not present

## 2019-12-07 DIAGNOSIS — G4733 Obstructive sleep apnea (adult) (pediatric): Secondary | ICD-10-CM | POA: Diagnosis not present

## 2019-12-09 DIAGNOSIS — G4733 Obstructive sleep apnea (adult) (pediatric): Secondary | ICD-10-CM | POA: Diagnosis not present

## 2019-12-28 ENCOUNTER — Encounter: Payer: Self-pay | Admitting: Neurology

## 2019-12-28 ENCOUNTER — Other Ambulatory Visit: Payer: Self-pay

## 2019-12-28 ENCOUNTER — Ambulatory Visit: Payer: HMO | Admitting: Neurology

## 2019-12-28 VITALS — BP 148/74 | HR 74 | Ht 72.0 in | Wt 284.0 lb

## 2019-12-28 DIAGNOSIS — Z9989 Dependence on other enabling machines and devices: Secondary | ICD-10-CM | POA: Diagnosis not present

## 2019-12-28 DIAGNOSIS — G4733 Obstructive sleep apnea (adult) (pediatric): Secondary | ICD-10-CM | POA: Diagnosis not present

## 2019-12-28 NOTE — Progress Notes (Signed)
Subjective:    Patient ID: Glenn [name] is a 69 y.o. male.  HPI     Star Age, MD, PhD [facility] [address], [address]O. [place], [place] [address]  Dear Glenn [name],  I saw your patient, Glenn [name], upon your kind request in my sleep clinic today for initial consultation of his sleep disorder, in particular, evaluation of his primary diagnosis of sleep apnea.  The patient is accompanied by his wife today.  As you know, Glenn [name] is a 69 year old right-handed gentleman with an underlying medical history of hypertension, gout, edema, diabetes, anxiety and obesity, who was previously diagnosed with obstructive sleep apnea and placed on PAP therapy.  Sleep study testing was through [facility] about 7 years ago. He brought a copy of his sleep study results from 09/29/2012.  He was diagnosed with severe obstructive sleep apnea, AHI was 51.1/h, O2 nadir was 68%.  He has been on AutoPap therapy.  He had a Respironics machine until June, but after the recall on the machine he has established on the ResMed air sense 10 auto.  He is completely compliant with treatment.  In the past 20 days, after he started his new machine on 12/09/2019, he has used his machine every night.  His pressure is minimum pressure of 10 cm, maximum of 14, 95th percentile is 11.9 cm, leak is fairly consistently on the high side with a 95th percentile at 40.4 L/min, average AHI at goal at 2/h.  On his previous machine he was also fully compliant with treatment with percent use days greater than 4 hours at 96.7% for the month of 11/04/2019 through 12/03/2019. I reviewed your office note from 11/30/2019.  His Epworth sleepiness score is 4 out of 24, fatigue severity score is 20 out of 63. He reports being fully compliant with treatment since he received his original machine.  He was previously followed by [facility] for sleep and for primary care but change providers.  He reports  no issues with his AutoPap machine, he uses nasal pillows, typically a medium but currently has a large.  He does have updated supplies.  His DME company is [employer].  He lives with his wife, retired as a Licensed conveyancer.  They have 2 grown children and 4 grandchildren.  He drinks caffeine occasionally in the form of tea, he does not utilize alcohol, non-smoker.  Bedtime is generally around 11 and rise time between 5 and 5:15 AM typically.  They have 2 dogs in the household.  He had a tonsillectomy as a child.  He denies nocturia but used to have nighttime urination before he was diagnosed with sleep apnea.  He denies recurrent morning headaches.  His Past Medical History Is Significant For: Past Medical History:  Diagnosis Date  . Diabetes ([place])   . Hypertension   . Migraine   . OSA (obstructive sleep apnea)     His Past Surgical History Is Significant For: Past Surgical History:  Procedure Laterality Date  . CATARACT EXTRACTION    . MOHS SURGERY     x2  . Quadracept repair    . TONSILLECTOMY      His Family History Is Significant For: Family History  Problem Relation Age of Onset  . Bladder Cancer Mother   . Kidney failure Father   . Prostate cancer Father   . Colon cancer Father   . Prostate cancer Maternal Grandfather   . Prostate cancer Paternal Grandfather     His Social  History Is Significant For: Social History   Socioeconomic History  . Marital status: Unknown    Spouse name: Not on file  . Number of children: Not on file  . Years of education: Not on file  . Highest education level: Not on file  Occupational History  . Not on file  Tobacco Use  . Smoking status: Never Smoker  . Smokeless tobacco: Never Used  Substance and Sexual Activity  . Alcohol use: Never  . Drug use: Never  . Sexual activity: Not on file  Other Topics Concern  . Not on file  Social History Narrative  . Not on file   Social Determinants of Health   Financial Resource Strain:   .  Difficulty of Paying Living Expenses:   Food Insecurity:   . Worried About Charity fundraiser in the Last Year:   . Arboriculturist in the Last Year:   Transportation Needs:   . Film/video editor (Medical):   Marland Kitchen Lack of Transportation (Non-Medical):   Physical Activity:   . Days of Exercise per Week:   . Minutes of Exercise per Session:   Stress:   . Feeling of Stress :   Social Connections:   . Frequency of Communication with Friends and Family:   . Frequency of Social Gatherings with Friends and Family:   . Attends Religious Services:   . Active Member of Clubs or Organizations:   . Attends Archivist Meetings:   Marland Kitchen Marital Status:     His Allergies Are:  Allergies  Allergen Reactions  . Trulicity [Dulaglutide]     Severe abdominal pain   :   His Current Medications Are:  Outpatient Encounter Medications as of 12/28/2019  Medication Sig  . allopurinol (ZYLOPRIM) 100 MG tablet Take 100 mg by mouth daily.  Marland Kitchen azelastine (ASTELIN) 0.1 % nasal spray Place 2 sprays into both nostrils 2 (two) times daily.  . cloNIDine (CATAPRES) 0.1 MG tablet Take by mouth.  . colchicine 0.6 MG tablet Take by mouth.  Marland Kitchen glipiZIDE (GLUCOTROL) 10 MG tablet Take 10 mg by mouth 2 (two) times daily.  . nateglinide (STARLIX) 120 MG tablet Take 120 mg by mouth 3 (three) times daily.  Marland Kitchen spironolactone (ALDACTONE) 25 MG tablet Take 25 mg by mouth 2 (two) times daily.  Marland Kitchen telmisartan (MICARDIS) 80 MG tablet Take 80 mg by mouth daily.  . TRESIBA FLEXTOUCH 200 UNIT/ML FlexTouch Pen   . triamterene-hydrochlorothiazide (MAXZIDE-25) 37.5-25 MG tablet Take 1 tablet by mouth every morning.   No facility-administered encounter medications on file as of 12/28/2019.  :  Review of Systems:  Out of a complete 14 point review of systems, all are reviewed and negative with the exception of these symptoms as listed below: His Epworth sleepiness score is 4 out of 24, fatigue severity score is 20 out of  63.  Objective:  Neurological Exam  Physical Exam Physical Examination:   Vitals:   12/28/19 1316  BP: (!) 148/74  Pulse: 74  SpO2: 97%    General Examination: The patient is a very pleasant 69 y.o. male in no acute distress. He appears well-developed and well-nourished and well groomed.   HEENT: Normocephalic, atraumatic, pupils are equal, round and reactive to light, extraocular tracking is good without limitation to gaze excursion or nystagmus noted. Hearing is grossly intact. Face is symmetric with normal facial animation. Speech is clear with no dysarthria noted. There is no hypophonia. There is no lip, neck/head, jaw  or voice tremor. Neck is supple with full range of passive and active motion. There are no carotid bruits on auscultation. Oropharynx exam reveals: mild mouth dryness, adequate dental hygiene and mild airway crowding. Mallampati is class II. Tongue protrudes centrally and palate elevates symmetrically. Tonsils are absent. Neck size is 18 in. Nasal inspection reveals mild septal deviation to the left.    Chest: Clear to auscultation without wheezing, rhonchi or crackles noted.  Heart: S1+S2+0, regular and normal without murmurs, rubs or gallops noted.   Abdomen: Soft, non-tender and non-distended with normal bowel sounds appreciated on auscultation.  Extremities: There is no pitting edema in the distal lower extremities bilaterally.   Skin: Warm and dry without trophic changes noted.   Musculoskeletal: exam reveals no obvious joint deformities, tenderness or joint swelling or erythema.   Neurologically:  Mental status: The patient is awake, alert and oriented in all 4 spheres. His immediate and remote memory, attention, language skills and fund of knowledge are appropriate. There is no evidence of aphasia, agnosia, apraxia or anomia. Speech is clear with normal prosody and enunciation. Thought process is linear. Mood is normal and affect is normal.  Cranial nerves  II - XII are as described above under HEENT exam.  Motor exam: Normal bulk, strength and tone is noted. There is no tremor, fine motor skills and coordination: grossly intact.  Cerebellar testing: No dysmetria or intention tremor. There is no truncal or gait ataxia.  Sensory exam: intact to light touch in the upper and lower extremities.  Gait, station and balance: He stands easily. No veering to one side is noted. No leaning to one side is noted. Posture is age-appropriate and stance is narrow based. Gait shows normal stride length and normal pace. No problems turning are noted.  Assessment and Plan:   In summary, Tadhg Eskew is a very pleasant 70 y.o.-year old male with an underlying medical history of hypertension, gout, edema, diabetes, anxiety and obesity, who presents for evaluation of his obstructive sleep apnea.  He was diagnosed with severe obstructive sleep apnea in 2014.  He has been on AutoPap therapy.  He recently received a new machine because of the recall on the [employer].  He is compliant with treatment and has benefited from treatment and all these years.  He is up-to-date with his supplies and settings are adequate, apnea scores at goal but leak is on the higher side.  This may be due to need for replacement of his headgear but also due to the larger size of the nasal pillows.  He has no need for new supplies, he is up-to-date through his DME company.  He is commended for his treatment adherence.  He has no pressing need for a sleep study at this time.  He is advised to follow-up routinely in 1 year for sleep apnea checkup.   We talked about the importance of treating obstructive sleep apnea especially when it is in the severe range.  He generally does not go without treatment with the recent exceptions of waiting for his new machine and also status post skin cancer surgery which resulted in a large area of skin grafting behind his right ear which made it difficult  for him to use his CPAP headgear at the time.  Please advised to follow-up with [place] in 1 year and call us with any interim questions or concerns.  I answered all their questions today and the patient and his wife were in agreement.  Thank you  very much for allowing me to participate in the care of this nice patient. If I can be of any further assistance to you please do not hesitate to call me at [phone].  Sincerely,   Star Age, MD, PhD

## 2019-12-28 NOTE — Patient Instructions (Signed)
It was nice to meet you both today.  You are fully compliant with your AutoPap machine.  I am glad you were able to get a replacement for your recalled Respironics machine.  Even though your sleep study was over 7 years ago in April 2014, I do not believe you need another sleep study quite yet.  Your sleep apnea scores look good enough on the current treatment settings.  Please try to get the correct size via nasal pillows as your air leakage from the mask is fairly consistently on the higher side, it may also have to do with replacing your headgear.  I would be happy to prescribe replacement supplies when the need arises.  For now, I would like to suggest a yearly checkup for your sleep apnea.

## 2020-01-01 DIAGNOSIS — E1169 Type 2 diabetes mellitus with other specified complication: Secondary | ICD-10-CM | POA: Diagnosis not present

## 2020-01-01 DIAGNOSIS — E782 Mixed hyperlipidemia: Secondary | ICD-10-CM | POA: Diagnosis not present

## 2020-01-01 DIAGNOSIS — G4733 Obstructive sleep apnea (adult) (pediatric): Secondary | ICD-10-CM | POA: Diagnosis not present

## 2020-01-01 DIAGNOSIS — I10 Essential (primary) hypertension: Secondary | ICD-10-CM | POA: Diagnosis not present

## 2020-01-01 DIAGNOSIS — E1159 Type 2 diabetes mellitus with other circulatory complications: Secondary | ICD-10-CM | POA: Diagnosis not present

## 2020-01-01 DIAGNOSIS — E1165 Type 2 diabetes mellitus with hyperglycemia: Secondary | ICD-10-CM | POA: Diagnosis not present

## 2020-01-08 DIAGNOSIS — G4733 Obstructive sleep apnea (adult) (pediatric): Secondary | ICD-10-CM | POA: Diagnosis not present

## 2020-01-12 DIAGNOSIS — G4733 Obstructive sleep apnea (adult) (pediatric): Secondary | ICD-10-CM | POA: Diagnosis not present

## 2020-01-18 DIAGNOSIS — L988 Other specified disorders of the skin and subcutaneous tissue: Secondary | ICD-10-CM | POA: Diagnosis not present

## 2020-01-18 DIAGNOSIS — D485 Neoplasm of uncertain behavior of skin: Secondary | ICD-10-CM | POA: Diagnosis not present

## 2020-01-18 DIAGNOSIS — C44319 Basal cell carcinoma of skin of other parts of face: Secondary | ICD-10-CM | POA: Diagnosis not present

## 2020-01-18 DIAGNOSIS — L821 Other seborrheic keratosis: Secondary | ICD-10-CM | POA: Diagnosis not present

## 2020-01-21 DIAGNOSIS — M109 Gout, unspecified: Secondary | ICD-10-CM | POA: Diagnosis not present

## 2020-01-21 DIAGNOSIS — R600 Localized edema: Secondary | ICD-10-CM | POA: Diagnosis not present

## 2020-01-27 DIAGNOSIS — B351 Tinea unguium: Secondary | ICD-10-CM | POA: Diagnosis not present

## 2020-01-27 DIAGNOSIS — M79671 Pain in right foot: Secondary | ICD-10-CM | POA: Diagnosis not present

## 2020-01-27 DIAGNOSIS — E119 Type 2 diabetes mellitus without complications: Secondary | ICD-10-CM | POA: Diagnosis not present

## 2020-01-27 DIAGNOSIS — M79672 Pain in left foot: Secondary | ICD-10-CM | POA: Diagnosis not present

## 2020-02-08 DIAGNOSIS — G4733 Obstructive sleep apnea (adult) (pediatric): Secondary | ICD-10-CM | POA: Diagnosis not present

## 2020-02-10 ENCOUNTER — Other Ambulatory Visit: Payer: Self-pay | Admitting: *Deleted

## 2020-02-10 NOTE — Patient Outreach (Signed)
  [facility]) Care Management Chronic Special Needs Program    02/10/2020  Name: Storm Sovine, DOB: Jul 03, 1950  MRN: [id number]   Mr. Kiwan Gadsden is enrolled in a chronic special needs plan for Diabetes.  Outreach call to client for initial telephone assessment (previously unable to reach), no answer to telephone, left voicemail requesting return phone call.  PLAN Outreach client in 1-2 weeks  Jacqlyn Larsen [facility], BSN [place], [place]

## 2020-02-18 ENCOUNTER — Encounter: Payer: Self-pay | Admitting: *Deleted

## 2020-02-18 ENCOUNTER — Other Ambulatory Visit: Payer: Self-pay | Admitting: *Deleted

## 2020-02-18 NOTE — Patient Outreach (Signed)
[facility]) Care Management Chronic Special Needs Program  02/18/2020  Name: Glenn [name] DOB: [birth year]  MRN: [id number]  Mr. Chaun Uemura is enrolled in a chronic special needs plan for Diabetes.. Chronic Care Management Coordinator telephoned client to review health risk assessment and to develop individualized care plan.  Introduced the chronic care management program, importance of client participation, and taking their care plan to all provider appointments and inpatient facilities.  Reviewed the transition of care process and possible referral to community care management.  Subjective: Client reports he lives with his spouse in [place] and this is working well for him, reports he is independent with ADL/ IADL's, continues to drive, walks several times per week, checks CBG BID, has goal "to keep AIC under 7".  Client reports he sees podiatrist and optometrist and "keeps up to date" with eye exams and foot exams.  Health Risk Assessment completed with client.   Goals Addressed              This Visit's Progress     Client understands the importance of follow-up with providers by attending scheduled visits   On track     Client reports attending all appointments with primary care provider Per medical record, client saw primary care provider 12/07/19 (tele-visit)      Client will verbalize knowledge of self management of Hypertension as evidences by BP reading of 140/90 or less; or as defined by provider        Plan to check blood pressure regularly.  If you do not have a B/P monitor (cuff), one can be provided to you.  Write results in your [employer] calendar (in the back section). Reviewed blood pressure medication from EMR. Take B/P medications as ordered.  Some may cause you to use the bathroom more. Plan to eat low salt and heart healthy meals full of fruits, vegetables, whole grains, lean protein and limit  fat and sugars. Increase activity as tolerated. Reviewed lifestyle modification- smoking cessation, weight control and reducing stress. EMMI education article provided "Controlling your blood pressure through lifestyle"  Review and plan to discuss with RN during next telephonic assessment. Use 24 hour nurse advice line at any time at [phone]       HEMOGLOBIN A1C < 7 (pt-stated)        Your last documented AIC is 7.4 on 10/08/19.  Have your [facility] checked every 6 months if you are at goal or every 3 months if you are not at goal. Check blood sugars daily before eating with goal of 80-130.  You can also check 1 1/2 hours after eating with goal of 180 or less. Plan to eat low carbohydrate and low salt meals, watch portion sizes and avoid sugar sweetened drinks.  Discussed carbohydrate control meals. Reviewed signs and symptoms of hyperglycemia (high blood sugar) and hypoglycemia (low blood sugar) and actions to take. Review [employer] calendar (sent in the mail) for diabetes action plan in the back. Reviewed nutrition counseling benefit provided by [employer]. Increase activity only if you are able to do it.  Follow doctor recommendations. EMMI education provided on "Diabetes and Diet".  Review and plan to discuss with RN during next telephonic assessment.         Maintain timely refills of diabetic medication as prescribed within the year .   On track     Contact your RN care manager if you have questions about medicines. Medication review completed  from EMR information. It is important to take your medications as prescribed. Reviewed use and possible side effects of diabetes medications.        Obtain annual  Lipid Profile, LDL-C        Per medical record review, Lipid profile completed on 07/21/19 LDL= 85 on 01/29/19. The goal for LDL is less than 70mg /dl as you are at high risk for complications. Try to avoid saturated fats, trans-fats and eat more  fiber. Client reports unable to take statin drug due to adverse reactions.       Obtain Annual Eye (retinal)  Exam    On track     Your last documented eye exam was on 10/26/19. Diabetes can affect your vision.  Plan to have a dilated eye exam every year. Advised client to keep and/ or schedule appointment with eye doctor.        Obtain Annual Foot Exam   On track     Your doctor should check your bare feet at each visit. Diabetes can affect the nerves in your feet, causing decreased feeling or numbness. Check your feet and in-between toes daily for cuts, bruises, redness, blisters or sores.  If you cannot reach them, use a mirror. Wash feet with soap and water, dry feet well especially between toes.  Don't use too much lotion. Wear shoes that are not too tight and don't walk barefoot.        Obtain annual screen for micro albuminuria (urine) , nephropathy (kidney problems)        Diabetes can affect your kidneys. It is important for your doctor to check your urine at least once a year  These tests show how your kidneys are working.       Obtain Hemoglobin A1C at least 2 times per year   On track     [facility] checked 01/29/19 and 10/08/19      Visit Primary Care Provider or Endocrinologist at least 2 times per year         Client saw primary care provider at least twice in 2020 and once in 2021.       Plan:    RN care manager faxed today's note with updated individualized care plan to primary care provider, mailed successful outreach letter to client along with updated individualized care plan, education articles, consent form, 24 hour nurse line magnet and HTA calendar.   Chronic care management coordination will outreach in:  9-12 months   Kassie Mends Nursing/RN [place] Case Manager, C-SNP [phone]

## 2020-03-01 DIAGNOSIS — E669 Obesity, unspecified: Secondary | ICD-10-CM | POA: Diagnosis not present

## 2020-03-01 DIAGNOSIS — I10 Essential (primary) hypertension: Secondary | ICD-10-CM | POA: Diagnosis not present

## 2020-03-01 DIAGNOSIS — E1165 Type 2 diabetes mellitus with hyperglycemia: Secondary | ICD-10-CM | POA: Diagnosis not present

## 2020-03-03 DIAGNOSIS — L820 Inflamed seborrheic keratosis: Secondary | ICD-10-CM | POA: Diagnosis not present

## 2020-03-03 DIAGNOSIS — C44319 Basal cell carcinoma of skin of other parts of face: Secondary | ICD-10-CM | POA: Diagnosis not present

## 2020-03-07 DIAGNOSIS — I10 Essential (primary) hypertension: Secondary | ICD-10-CM | POA: Diagnosis not present

## 2020-03-07 DIAGNOSIS — E782 Mixed hyperlipidemia: Secondary | ICD-10-CM | POA: Diagnosis not present

## 2020-03-07 DIAGNOSIS — E1165 Type 2 diabetes mellitus with hyperglycemia: Secondary | ICD-10-CM | POA: Diagnosis not present

## 2020-03-07 DIAGNOSIS — Z23 Encounter for immunization: Secondary | ICD-10-CM | POA: Diagnosis not present

## 2020-03-09 DIAGNOSIS — L905 Scar conditions and fibrosis of skin: Secondary | ICD-10-CM | POA: Diagnosis not present

## 2020-03-10 DIAGNOSIS — G4733 Obstructive sleep apnea (adult) (pediatric): Secondary | ICD-10-CM | POA: Diagnosis not present

## 2020-04-08 DIAGNOSIS — E782 Mixed hyperlipidemia: Secondary | ICD-10-CM | POA: Diagnosis not present

## 2020-04-08 DIAGNOSIS — E1165 Type 2 diabetes mellitus with hyperglycemia: Secondary | ICD-10-CM | POA: Diagnosis not present

## 2020-04-08 DIAGNOSIS — M109 Gout, unspecified: Secondary | ICD-10-CM | POA: Diagnosis not present

## 2020-04-09 DIAGNOSIS — G4733 Obstructive sleep apnea (adult) (pediatric): Secondary | ICD-10-CM | POA: Diagnosis not present

## 2020-04-11 DIAGNOSIS — M109 Gout, unspecified: Secondary | ICD-10-CM | POA: Diagnosis not present

## 2020-04-11 DIAGNOSIS — E1165 Type 2 diabetes mellitus with hyperglycemia: Secondary | ICD-10-CM | POA: Diagnosis not present

## 2020-04-11 DIAGNOSIS — Z789 Other specified health status: Secondary | ICD-10-CM | POA: Diagnosis not present

## 2020-04-11 DIAGNOSIS — I10 Essential (primary) hypertension: Secondary | ICD-10-CM | POA: Diagnosis not present

## 2020-04-11 DIAGNOSIS — E782 Mixed hyperlipidemia: Secondary | ICD-10-CM | POA: Diagnosis not present

## 2020-04-11 DIAGNOSIS — E1121 Type 2 diabetes mellitus with diabetic nephropathy: Secondary | ICD-10-CM | POA: Diagnosis not present

## 2020-04-29 DIAGNOSIS — B351 Tinea unguium: Secondary | ICD-10-CM | POA: Diagnosis not present

## 2020-04-29 DIAGNOSIS — M79672 Pain in left foot: Secondary | ICD-10-CM | POA: Diagnosis not present

## 2020-04-29 DIAGNOSIS — M79671 Pain in right foot: Secondary | ICD-10-CM | POA: Diagnosis not present

## 2020-04-29 DIAGNOSIS — E119 Type 2 diabetes mellitus without complications: Secondary | ICD-10-CM | POA: Diagnosis not present

## 2020-05-09 DIAGNOSIS — Z1331 Encounter for screening for depression: Secondary | ICD-10-CM | POA: Diagnosis not present

## 2020-05-09 DIAGNOSIS — G720 Drug-induced myopathy: Secondary | ICD-10-CM | POA: Diagnosis not present

## 2020-05-09 DIAGNOSIS — T466X5S Adverse effect of antihyperlipidemic and antiarteriosclerotic drugs, sequela: Secondary | ICD-10-CM | POA: Diagnosis not present

## 2020-05-09 DIAGNOSIS — Z0000 Encounter for general adult medical examination without abnormal findings: Secondary | ICD-10-CM | POA: Diagnosis not present

## 2020-05-09 DIAGNOSIS — Z1339 Encounter for screening examination for other mental health and behavioral disorders: Secondary | ICD-10-CM | POA: Diagnosis not present

## 2020-05-23 DIAGNOSIS — L578 Other skin changes due to chronic exposure to nonionizing radiation: Secondary | ICD-10-CM | POA: Diagnosis not present

## 2020-05-23 DIAGNOSIS — Z85828 Personal history of other malignant neoplasm of skin: Secondary | ICD-10-CM | POA: Diagnosis not present

## 2020-05-23 DIAGNOSIS — L570 Actinic keratosis: Secondary | ICD-10-CM | POA: Diagnosis not present

## 2020-05-23 DIAGNOSIS — L821 Other seborrheic keratosis: Secondary | ICD-10-CM | POA: Diagnosis not present

## 2020-05-23 DIAGNOSIS — L718 Other rosacea: Secondary | ICD-10-CM | POA: Diagnosis not present

## 2020-05-24 DIAGNOSIS — I10 Essential (primary) hypertension: Secondary | ICD-10-CM | POA: Diagnosis not present

## 2020-05-24 DIAGNOSIS — E1165 Type 2 diabetes mellitus with hyperglycemia: Secondary | ICD-10-CM | POA: Diagnosis not present

## 2020-05-27 ENCOUNTER — Other Ambulatory Visit: Payer: Self-pay | Admitting: *Deleted

## 2020-05-27 NOTE — Patient Outreach (Signed)
  [facility]) Care Management Chronic Special Needs Program    05/27/2020  Name: Glenn [name], DOB: 04/08/1951  MRN: [id number]   Mr. Glenn [name] is enrolled in a chronic special needs plan for Diabetes.  [facility] care management will continue to provide services for this member through 06/10/20.  The [employer] care management team will assume care 06/11/20.  Jacqlyn Larsen [facility], BSN [place], [place]

## 2020-06-09 DIAGNOSIS — G4733 Obstructive sleep apnea (adult) (pediatric): Secondary | ICD-10-CM | POA: Diagnosis not present

## 2020-06-09 DIAGNOSIS — M5441 Lumbago with sciatica, right side: Secondary | ICD-10-CM | POA: Diagnosis not present

## 2020-06-09 DIAGNOSIS — M25561 Pain in right knee: Secondary | ICD-10-CM | POA: Diagnosis not present

## 2020-06-09 DIAGNOSIS — M545 Low back pain, unspecified: Secondary | ICD-10-CM | POA: Diagnosis not present

## 2020-06-11 DIAGNOSIS — G4733 Obstructive sleep apnea (adult) (pediatric): Secondary | ICD-10-CM | POA: Diagnosis not present

## 2020-06-16 ENCOUNTER — Other Ambulatory Visit: Payer: Self-pay | Admitting: *Deleted

## 2020-07-07 DIAGNOSIS — Z0000 Encounter for general adult medical examination without abnormal findings: Secondary | ICD-10-CM | POA: Diagnosis not present

## 2020-07-07 DIAGNOSIS — M25561 Pain in right knee: Secondary | ICD-10-CM | POA: Diagnosis not present

## 2020-07-07 DIAGNOSIS — R739 Hyperglycemia, unspecified: Secondary | ICD-10-CM | POA: Diagnosis not present

## 2020-07-07 DIAGNOSIS — M545 Low back pain, unspecified: Secondary | ICD-10-CM | POA: Diagnosis not present

## 2020-07-07 DIAGNOSIS — M1711 Unilateral primary osteoarthritis, right knee: Secondary | ICD-10-CM | POA: Diagnosis not present

## 2020-07-07 DIAGNOSIS — I10 Essential (primary) hypertension: Secondary | ICD-10-CM | POA: Diagnosis not present

## 2020-07-07 DIAGNOSIS — E782 Mixed hyperlipidemia: Secondary | ICD-10-CM | POA: Diagnosis not present

## 2020-07-10 DIAGNOSIS — G4733 Obstructive sleep apnea (adult) (pediatric): Secondary | ICD-10-CM | POA: Diagnosis not present

## 2020-07-12 DIAGNOSIS — E782 Mixed hyperlipidemia: Secondary | ICD-10-CM | POA: Diagnosis not present

## 2020-07-12 DIAGNOSIS — I10 Essential (primary) hypertension: Secondary | ICD-10-CM | POA: Diagnosis not present

## 2020-07-12 DIAGNOSIS — E669 Obesity, unspecified: Secondary | ICD-10-CM | POA: Diagnosis not present

## 2020-07-12 DIAGNOSIS — E1165 Type 2 diabetes mellitus with hyperglycemia: Secondary | ICD-10-CM | POA: Diagnosis not present

## 2020-07-14 DIAGNOSIS — G4733 Obstructive sleep apnea (adult) (pediatric): Secondary | ICD-10-CM | POA: Diagnosis not present

## 2020-08-03 DIAGNOSIS — B351 Tinea unguium: Secondary | ICD-10-CM | POA: Diagnosis not present

## 2020-08-03 DIAGNOSIS — M79672 Pain in left foot: Secondary | ICD-10-CM | POA: Diagnosis not present

## 2020-08-03 DIAGNOSIS — E119 Type 2 diabetes mellitus without complications: Secondary | ICD-10-CM | POA: Diagnosis not present

## 2020-08-03 DIAGNOSIS — M79671 Pain in right foot: Secondary | ICD-10-CM | POA: Diagnosis not present

## 2020-08-08 DIAGNOSIS — G4733 Obstructive sleep apnea (adult) (pediatric): Secondary | ICD-10-CM | POA: Diagnosis not present

## 2020-08-23 DIAGNOSIS — E1122 Type 2 diabetes mellitus with diabetic chronic kidney disease: Secondary | ICD-10-CM | POA: Diagnosis not present

## 2020-08-23 DIAGNOSIS — E1165 Type 2 diabetes mellitus with hyperglycemia: Secondary | ICD-10-CM | POA: Diagnosis not present

## 2020-08-23 DIAGNOSIS — E782 Mixed hyperlipidemia: Secondary | ICD-10-CM | POA: Diagnosis not present

## 2020-08-23 DIAGNOSIS — I10 Essential (primary) hypertension: Secondary | ICD-10-CM | POA: Diagnosis not present

## 2020-08-30 DIAGNOSIS — E782 Mixed hyperlipidemia: Secondary | ICD-10-CM | POA: Diagnosis not present

## 2020-08-30 DIAGNOSIS — E1165 Type 2 diabetes mellitus with hyperglycemia: Secondary | ICD-10-CM | POA: Diagnosis not present

## 2020-08-30 DIAGNOSIS — I10 Essential (primary) hypertension: Secondary | ICD-10-CM | POA: Diagnosis not present

## 2020-09-07 DIAGNOSIS — G4733 Obstructive sleep apnea (adult) (pediatric): Secondary | ICD-10-CM | POA: Diagnosis not present

## 2020-09-15 DIAGNOSIS — S8992XA Unspecified injury of left lower leg, initial encounter: Secondary | ICD-10-CM | POA: Diagnosis not present

## 2020-09-15 DIAGNOSIS — E1165 Type 2 diabetes mellitus with hyperglycemia: Secondary | ICD-10-CM | POA: Diagnosis not present

## 2020-09-30 DIAGNOSIS — E1165 Type 2 diabetes mellitus with hyperglycemia: Secondary | ICD-10-CM | POA: Diagnosis not present

## 2020-10-06 DIAGNOSIS — E162 Hypoglycemia, unspecified: Secondary | ICD-10-CM | POA: Diagnosis not present

## 2020-10-06 DIAGNOSIS — E1165 Type 2 diabetes mellitus with hyperglycemia: Secondary | ICD-10-CM | POA: Diagnosis not present

## 2020-10-08 DIAGNOSIS — G4733 Obstructive sleep apnea (adult) (pediatric): Secondary | ICD-10-CM | POA: Diagnosis not present

## 2020-10-12 DIAGNOSIS — Z23 Encounter for immunization: Secondary | ICD-10-CM | POA: Diagnosis not present

## 2020-10-21 DIAGNOSIS — I10 Essential (primary) hypertension: Secondary | ICD-10-CM | POA: Diagnosis not present

## 2020-10-21 DIAGNOSIS — E782 Mixed hyperlipidemia: Secondary | ICD-10-CM | POA: Diagnosis not present

## 2020-10-21 DIAGNOSIS — E1165 Type 2 diabetes mellitus with hyperglycemia: Secondary | ICD-10-CM | POA: Diagnosis not present

## 2020-10-21 DIAGNOSIS — E559 Vitamin D deficiency, unspecified: Secondary | ICD-10-CM | POA: Diagnosis not present

## 2020-10-24 DIAGNOSIS — I152 Hypertension secondary to endocrine disorders: Secondary | ICD-10-CM | POA: Diagnosis not present

## 2020-10-24 DIAGNOSIS — E782 Mixed hyperlipidemia: Secondary | ICD-10-CM | POA: Diagnosis not present

## 2020-10-24 DIAGNOSIS — E559 Vitamin D deficiency, unspecified: Secondary | ICD-10-CM | POA: Diagnosis not present

## 2020-10-24 DIAGNOSIS — Z789 Other specified health status: Secondary | ICD-10-CM | POA: Diagnosis not present

## 2020-10-24 DIAGNOSIS — E1165 Type 2 diabetes mellitus with hyperglycemia: Secondary | ICD-10-CM | POA: Diagnosis not present

## 2020-10-24 DIAGNOSIS — I10 Essential (primary) hypertension: Secondary | ICD-10-CM | POA: Diagnosis not present

## 2020-10-31 DIAGNOSIS — E119 Type 2 diabetes mellitus without complications: Secondary | ICD-10-CM | POA: Diagnosis not present

## 2020-10-31 DIAGNOSIS — M79671 Pain in right foot: Secondary | ICD-10-CM | POA: Diagnosis not present

## 2020-10-31 DIAGNOSIS — B351 Tinea unguium: Secondary | ICD-10-CM | POA: Diagnosis not present

## 2020-10-31 DIAGNOSIS — M79672 Pain in left foot: Secondary | ICD-10-CM | POA: Diagnosis not present

## 2020-11-07 DIAGNOSIS — G4733 Obstructive sleep apnea (adult) (pediatric): Secondary | ICD-10-CM | POA: Diagnosis not present

## 2020-11-15 DIAGNOSIS — H26491 Other secondary cataract, right eye: Secondary | ICD-10-CM | POA: Diagnosis not present

## 2020-11-15 DIAGNOSIS — G43801 Other migraine, not intractable, with status migrainosus: Secondary | ICD-10-CM | POA: Diagnosis not present

## 2020-11-15 DIAGNOSIS — E1165 Type 2 diabetes mellitus with hyperglycemia: Secondary | ICD-10-CM | POA: Diagnosis not present

## 2020-11-15 DIAGNOSIS — D3132 Benign neoplasm of left choroid: Secondary | ICD-10-CM | POA: Diagnosis not present

## 2020-11-26 ENCOUNTER — Emergency Department (HOSPITAL_BASED_OUTPATIENT_CLINIC_OR_DEPARTMENT_OTHER): Payer: HMO

## 2020-11-26 ENCOUNTER — Encounter (HOSPITAL_BASED_OUTPATIENT_CLINIC_OR_DEPARTMENT_OTHER): Payer: Self-pay

## 2020-11-26 ENCOUNTER — Emergency Department (HOSPITAL_BASED_OUTPATIENT_CLINIC_OR_DEPARTMENT_OTHER)
Admission: EM | Admit: 2020-11-26 | Discharge: 2020-11-26 | Disposition: A | Discharge status: Home / Self Care | Payer: HMO | Attending: Emergency Medicine | Admitting: Emergency Medicine

## 2020-11-26 ENCOUNTER — Other Ambulatory Visit: Payer: Self-pay

## 2020-11-26 DIAGNOSIS — W010XXA Fall on same level from slipping, tripping and stumbling without subsequent striking against object, initial encounter: Secondary | ICD-10-CM | POA: Insufficient documentation

## 2020-11-26 DIAGNOSIS — Z7984 Long term (current) use of oral hypoglycemic drugs: Secondary | ICD-10-CM | POA: Diagnosis not present

## 2020-11-26 DIAGNOSIS — E119 Type 2 diabetes mellitus without complications: Secondary | ICD-10-CM | POA: Diagnosis not present

## 2020-11-26 DIAGNOSIS — M7989 Other specified soft tissue disorders: Secondary | ICD-10-CM | POA: Diagnosis not present

## 2020-11-26 DIAGNOSIS — S8002XA Contusion of left knee, initial encounter: Secondary | ICD-10-CM | POA: Diagnosis not present

## 2020-11-26 DIAGNOSIS — Z79899 Other long term (current) drug therapy: Secondary | ICD-10-CM | POA: Insufficient documentation

## 2020-11-26 DIAGNOSIS — S8992XA Unspecified injury of left lower leg, initial encounter: Secondary | ICD-10-CM | POA: Diagnosis present

## 2020-11-26 DIAGNOSIS — M25512 Pain in left shoulder: Secondary | ICD-10-CM | POA: Diagnosis not present

## 2020-11-26 DIAGNOSIS — M19012 Primary osteoarthritis, left shoulder: Secondary | ICD-10-CM | POA: Diagnosis not present

## 2020-11-26 DIAGNOSIS — Z794 Long term (current) use of insulin: Secondary | ICD-10-CM | POA: Diagnosis not present

## 2020-11-26 DIAGNOSIS — I10 Essential (primary) hypertension: Secondary | ICD-10-CM | POA: Insufficient documentation

## 2020-11-26 MED ORDER — HYDROCODONE-ACETAMINOPHEN 5-325 MG PO TABS
1.0000 | ORAL_TABLET | Freq: Once | ORAL | Status: AC
  Administered 2020-11-26: 1 via ORAL
  Filled 2020-11-26: qty 1

## 2020-11-26 MED ORDER — TRAMADOL HCL 50 MG PO TABS: 50.0000 mg | ORAL_TABLET | Freq: Three times a day (TID) | ORAL | 0 refills | Status: DC | PRN

## 2020-11-26 NOTE — ED Triage Notes (Signed)
Tripped and fell on concrete driveway Pain in Left knee and shoulder.

## 2020-11-26 NOTE — ED Provider Notes (Signed)
[facility] EMERGENCY DEPARTMENT Provider Note   CSN: [id number] Arrival date & time: 11/26/20  1243     History Chief Complaint  Patient presents with   Glenn [name] is a 70 y.o. male.  Patient with mechanical fall prior to arrival.  Landed mostly on his left knee but also on his left shoulder.  Having significant pain and swelling to his left knee.  Having some pain to his left shoulder.  Did not hit his head or lose consciousness.  Not having any headaches or neck pain.  Has had prior quadricep tendon repair to the left knee.  The history is provided by the patient.  Fall This is a new problem. The current episode started less than 1 hour ago. The problem has not changed since onset.Pertinent negatives include no chest pain, no abdominal pain, no headaches and no shortness of breath. The symptoms are aggravated by bending. The symptoms are relieved by rest. He has tried nothing for the symptoms. The treatment provided no relief.      Past Medical History:  Diagnosis Date   Diabetes ([place])    Hypertension    Migraine    OSA (obstructive sleep apnea)     There are no problems to display for this patient.   Past Surgical History:  Procedure Laterality Date   CATARACT EXTRACTION     MOHS SURGERY     x2   Quadracept repair     TONSILLECTOMY         Family History  Problem Relation Age of Onset   Bladder Cancer Mother    Kidney failure Father    Prostate cancer Father    Colon cancer Father    Prostate cancer Maternal Grandfather    Prostate cancer Paternal Grandfather     Social History   Tobacco Use   Smoking status: Never   Smokeless tobacco: Never  Vaping Use   Vaping Use: Never used  Substance Use Topics   Alcohol use: Never   Drug use: Never    Home Medications Prior to Admission medications   Medication Sig Start Date End Date Taking? Authorizing Provider  allopurinol (ZYLOPRIM) 100 MG tablet Take 100 mg by mouth daily.  10/28/19   [provider]  aspirin EC 81 MG tablet Take 81 mg by mouth daily. Swallow whole.    [provider]  azelastine (ASTELIN) 0.1 % nasal spray Place 2 sprays into both nostrils 2 (two) times daily. 08/23/19   [provider]  cholecalciferol (VITAMIN D3) 25 MCG (1000 UNIT) tablet Take 2,000 Units by mouth daily.    [provider]  cloNIDine (CATAPRES) 0.1 MG tablet Take by mouth. 12/25/19   [provider]  colchicine 0.6 MG tablet Take by mouth. 12/06/19   [provider]  glipiZIDE (GLUCOTROL) 10 MG tablet Take 10 mg by mouth 2 (two) times daily. 12/08/19   [provider]  nateglinide (STARLIX) 120 MG tablet Take 120 mg by mouth 3 (three) times daily. 11/11/19   [provider]  spironolactone (ALDACTONE) 25 MG tablet Take 25 mg by mouth 2 (two) times daily. 12/12/19   [provider]  telmisartan (MICARDIS) 80 MG tablet Take 80 mg by mouth daily. 12/18/19   [provider]  TRESIBA FLEXTOUCH 200 UNIT/ML FlexTouch Pen 40 Units.  10/23/19   [provider]  triamterene-hydrochlorothiazide (MAXZIDE-25) 37.5-25 MG tablet Take 1 tablet by mouth every morning. 12/18/19   [provider]  Allergies    Trulicity [dulaglutide]  Review of Systems   Review of Systems  Constitutional:  Negative for chills and fever.  HENT:  Negative for ear pain and sore throat.   Eyes:  Negative for pain and visual disturbance.  Respiratory:  Negative for cough and shortness of breath.   Cardiovascular:  Negative for chest pain and palpitations.  Gastrointestinal:  Negative for abdominal pain and vomiting.  Genitourinary:  Negative for dysuria and hematuria.  Musculoskeletal:  Positive for arthralgias, gait problem and joint swelling. Negative for back pain.  Skin:  Negative for color change and rash.  Neurological:  Negative for dizziness, seizures, syncope, weakness, numbness and headaches.  All other  systems reviewed and are negative.  Physical Exam Updated Vital Signs  ED Triage Vitals  Enc Vitals Group     BP 11/26/20 1253 (!) 179/58     Pulse Rate 11/26/20 1253 75     Resp 11/26/20 1253 20     Temp 11/26/20 1253 98.2 F (36.8 C)     Temp Source 11/26/20 1253 Oral     SpO2 11/26/20 1253 97 %     Weight 11/26/20 1256 280 lb (127 kg)     Height 11/26/20 1256 5\' 11"  (1.803 m)     Head Circumference --      Peak Flow --      Pain Score 11/26/20 1255 8     Pain Loc --      Pain Edu? --      Excl. in [place]? --      Physical Exam Vitals and nursing note reviewed.  Constitutional:      General: He is not in acute distress.    Appearance: He is well-developed. He is not ill-appearing.  HENT:     Head: Normocephalic and atraumatic.  Eyes:     Extraocular Movements: Extraocular movements intact.     Conjunctiva/sclera: Conjunctivae normal.     Pupils: Pupils are equal, round, and reactive to light.  Cardiovascular:     Rate and Rhythm: Normal rate and regular rhythm.     Pulses: Normal pulses.     Heart sounds: No murmur heard. Pulmonary:     Effort: Pulmonary effort is normal. No respiratory distress.     Breath sounds: Normal breath sounds.  Abdominal:     Palpations: Abdomen is soft.     Tenderness: There is no abdominal tenderness.  Musculoskeletal:        General: Swelling and tenderness present.     Cervical back: Normal range of motion and neck supple. No tenderness.     Comments: Patient with tenderness and swelling to the left knee in the prepatellar bursa space, overall has limited range of motion in the left knee with flexion and extension but is able to do that, has some tenderness to left shoulder, no midline spinal tenderness  Skin:    General: Skin is warm and dry.     Capillary Refill: Capillary refill takes less than 2 seconds.     Findings: Bruising present.  Neurological:     General: No focal deficit present.     Mental Status: He is alert and  oriented to person, place, and time.     Cranial Nerves: No cranial nerve deficit.     Sensory: No sensory deficit.     Motor: No weakness.     Comments: 5+ out of 5 strength throughout, normal sensation, no drift, normal finger-to-nose finger    ED Results /  Procedures / Treatments   Labs (all labs ordered are listed, but only abnormal results are displayed) Labs Reviewed - No data to display  EKG None  Radiology DG Shoulder Left  Result Date: 11/26/2020 CLINICAL DATA:  Left shoulder pain post fall EXAM: LEFT SHOULDER - 2+ VIEW COMPARISON:  None. FINDINGS: There is no evidence of fracture or dislocation. Mild osteoarthritic changes of the glenohumeral joint and acromioclavicular joint. Soft tissues are unremarkable. IMPRESSION: 1. No acute fracture or dislocation identified about the left shoulder. 2. Mild osteoarthritic changes of the glenohumeral and acromioclavicular joints. Electronically Signed   By: Fidela Salisbury M.D.   On: 11/26/2020 14:09   DG Knee Complete 4 Views Left  Result Date: 11/26/2020 CLINICAL DATA:  Fall EXAM: LEFT KNEE - COMPLETE 4+ VIEW COMPARISON:  None. FINDINGS: Normal alignment no fracture or joint effusion. Prominent soft tissue swelling anterior to the patella. IMPRESSION: Anterior soft tissue swelling.  Negative for fracture. Electronically Signed   By: Franchot Gallo M.D.   On: 11/26/2020 14:09    Procedures Procedures   Medications Ordered in ED Medications  HYDROcodone-acetaminophen (NORCO/VICODIN) 5-325 MG per tablet 1 tablet (1 tablet Oral Given 11/26/20 1313)    ED Course  I have reviewed the triage vital signs and the nursing notes.  Pertinent labs & imaging results that were available during my care of the patient were reviewed by me and considered in my medical decision making (see chart for details).    MDM Rules/Calculators/A&P                          Glenn [name] is here after mechanical fall.  Pain mostly to the left knee  that has significant swelling and bruising.  He is not on blood thinners.  Did not hit his head or lose consciousness.  No neck pain.  Having some left shoulder pain.  Not ambulatory since the fall that happened just prior to arrival.  History of quadricep tendon repair to the same left knee that is injured now.  He is able to flex and extend the left lower extremity without much difficulty but does have pain.  Has significant bruising and swelling to the left knee mostly in the prepatellar space.  Will get x-ray of knee and left shoulder.  May need to consider CT scan to further evaluate left knee injury.  Will talk with case management about a walker as if possible to get him home would likely benefit from a knee immobilizer and a walker.  X-rays negative for fracture.  Overall patient with significant left knee hematoma and swelling the prepatellar space.  Will place in knee immobilizer.  Patient actually already has a sit walker available to him.  He and his wife feel comfortable with knee immobilizer and walker.  He has an orthopedic doctor he can follow-up with.  Would likely benefit from physical therapy.  Discharged in good condition.  This chart was dictated using voice recognition software.  Despite best efforts to proofread,  errors can occur which can change the documentation meaning.   Final Clinical Impression(s) / ED Diagnoses Final diagnoses:  Contusion of left knee, initial encounter  Acute pain of left shoulder    Rx / DC Orders ED Discharge Orders     None        Lennice Sites, DO 11/26/20 1428

## 2020-11-26 NOTE — ED Notes (Signed)
Attempt to draw blood x2 unsuccessful

## 2020-11-26 NOTE — Discharge Instructions (Addendum)
Recommend Tylenol and ibuprofen for pain.  Recommend ice as much as possible to the left knee.  Wear knee immobilizer at all times while ambulating and with transfers.  It is okay to take off knee immobilizer at night when sleeping or or if you are sitting down and need to break.  Weight-bear as tolerated using a walker.  Follow-up with your orthopedic doctor.

## 2020-11-26 NOTE — ED Notes (Signed)
Attempted lab draw x 1 w/o success

## 2020-11-28 DIAGNOSIS — M25512 Pain in left shoulder: Secondary | ICD-10-CM | POA: Diagnosis not present

## 2020-11-28 DIAGNOSIS — M25562 Pain in left knee: Secondary | ICD-10-CM | POA: Diagnosis not present

## 2020-11-28 DIAGNOSIS — M7042 Prepatellar bursitis, left knee: Secondary | ICD-10-CM | POA: Diagnosis not present

## 2020-12-02 DIAGNOSIS — M25512 Pain in left shoulder: Secondary | ICD-10-CM | POA: Diagnosis not present

## 2020-12-02 DIAGNOSIS — M25562 Pain in left knee: Secondary | ICD-10-CM | POA: Diagnosis not present

## 2020-12-07 DIAGNOSIS — M25512 Pain in left shoulder: Secondary | ICD-10-CM | POA: Diagnosis not present

## 2020-12-07 DIAGNOSIS — S8002XA Contusion of left knee, initial encounter: Secondary | ICD-10-CM | POA: Diagnosis not present

## 2020-12-07 DIAGNOSIS — R600 Localized edema: Secondary | ICD-10-CM | POA: Diagnosis not present

## 2020-12-07 DIAGNOSIS — M25562 Pain in left knee: Secondary | ICD-10-CM | POA: Diagnosis not present

## 2020-12-07 DIAGNOSIS — I83893 Varicose veins of bilateral lower extremities with other complications: Secondary | ICD-10-CM | POA: Diagnosis not present

## 2020-12-08 DIAGNOSIS — M7989 Other specified soft tissue disorders: Secondary | ICD-10-CM | POA: Diagnosis not present

## 2020-12-08 DIAGNOSIS — G4733 Obstructive sleep apnea (adult) (pediatric): Secondary | ICD-10-CM | POA: Diagnosis not present

## 2020-12-09 DIAGNOSIS — M25562 Pain in left knee: Secondary | ICD-10-CM | POA: Diagnosis not present

## 2020-12-09 DIAGNOSIS — M25512 Pain in left shoulder: Secondary | ICD-10-CM | POA: Diagnosis not present

## 2020-12-15 DIAGNOSIS — M25512 Pain in left shoulder: Secondary | ICD-10-CM | POA: Diagnosis not present

## 2020-12-15 DIAGNOSIS — M170 Bilateral primary osteoarthritis of knee: Secondary | ICD-10-CM | POA: Diagnosis not present

## 2020-12-15 DIAGNOSIS — M71169 Other infective bursitis, unspecified knee: Secondary | ICD-10-CM | POA: Diagnosis not present

## 2020-12-15 DIAGNOSIS — W19XXXA Unspecified fall, initial encounter: Secondary | ICD-10-CM | POA: Diagnosis not present

## 2020-12-15 DIAGNOSIS — M25562 Pain in left knee: Secondary | ICD-10-CM | POA: Diagnosis not present

## 2020-12-15 DIAGNOSIS — R600 Localized edema: Secondary | ICD-10-CM | POA: Diagnosis not present

## 2020-12-20 DIAGNOSIS — M25512 Pain in left shoulder: Secondary | ICD-10-CM | POA: Diagnosis not present

## 2020-12-20 DIAGNOSIS — M25562 Pain in left knee: Secondary | ICD-10-CM | POA: Diagnosis not present

## 2020-12-21 DIAGNOSIS — I10 Essential (primary) hypertension: Secondary | ICD-10-CM | POA: Diagnosis not present

## 2020-12-21 DIAGNOSIS — R600 Localized edema: Secondary | ICD-10-CM | POA: Diagnosis not present

## 2020-12-21 DIAGNOSIS — M109 Gout, unspecified: Secondary | ICD-10-CM | POA: Diagnosis not present

## 2020-12-22 DIAGNOSIS — M25562 Pain in left knee: Secondary | ICD-10-CM | POA: Diagnosis not present

## 2020-12-22 DIAGNOSIS — R600 Localized edema: Secondary | ICD-10-CM | POA: Diagnosis not present

## 2020-12-22 DIAGNOSIS — M25512 Pain in left shoulder: Secondary | ICD-10-CM | POA: Diagnosis not present

## 2020-12-22 DIAGNOSIS — M109 Gout, unspecified: Secondary | ICD-10-CM | POA: Diagnosis not present

## 2020-12-22 DIAGNOSIS — M71169 Other infective bursitis, unspecified knee: Secondary | ICD-10-CM | POA: Diagnosis not present

## 2020-12-22 DIAGNOSIS — E1165 Type 2 diabetes mellitus with hyperglycemia: Secondary | ICD-10-CM | POA: Diagnosis not present

## 2020-12-28 ENCOUNTER — Ambulatory Visit: Payer: HMO | Admitting: Neurology

## 2020-12-30 DIAGNOSIS — M25512 Pain in left shoulder: Secondary | ICD-10-CM | POA: Diagnosis not present

## 2020-12-30 DIAGNOSIS — M25562 Pain in left knee: Secondary | ICD-10-CM | POA: Diagnosis not present

## 2021-01-02 DIAGNOSIS — M7042 Prepatellar bursitis, left knee: Secondary | ICD-10-CM | POA: Diagnosis not present

## 2021-01-02 DIAGNOSIS — M25512 Pain in left shoulder: Secondary | ICD-10-CM | POA: Diagnosis not present

## 2021-01-03 ENCOUNTER — Other Ambulatory Visit: Payer: Self-pay | Admitting: Orthopedic Surgery

## 2021-01-03 DIAGNOSIS — M25512 Pain in left shoulder: Secondary | ICD-10-CM

## 2021-01-04 ENCOUNTER — Ambulatory Visit
Admission: RE | Admit: 2021-01-04 | Discharge: 2021-01-04 | Disposition: A | Discharge status: Home / Self Care | Payer: HMO | Source: Ambulatory Visit | Attending: Orthopedic Surgery | Admitting: Orthopedic Surgery

## 2021-01-04 DIAGNOSIS — M19012 Primary osteoarthritis, left shoulder: Secondary | ICD-10-CM | POA: Diagnosis not present

## 2021-01-04 DIAGNOSIS — M25512 Pain in left shoulder: Secondary | ICD-10-CM

## 2021-01-12 DIAGNOSIS — G4733 Obstructive sleep apnea (adult) (pediatric): Secondary | ICD-10-CM | POA: Diagnosis not present

## 2021-01-27 DIAGNOSIS — M109 Gout, unspecified: Secondary | ICD-10-CM | POA: Diagnosis not present

## 2021-01-27 DIAGNOSIS — I10 Essential (primary) hypertension: Secondary | ICD-10-CM | POA: Diagnosis not present

## 2021-01-27 DIAGNOSIS — E1165 Type 2 diabetes mellitus with hyperglycemia: Secondary | ICD-10-CM | POA: Diagnosis not present

## 2021-01-27 DIAGNOSIS — R739 Hyperglycemia, unspecified: Secondary | ICD-10-CM | POA: Diagnosis not present

## 2021-01-30 DIAGNOSIS — I129 Hypertensive chronic kidney disease with stage 1 through stage 4 chronic kidney disease, or unspecified chronic kidney disease: Secondary | ICD-10-CM | POA: Diagnosis not present

## 2021-01-30 DIAGNOSIS — M109 Gout, unspecified: Secondary | ICD-10-CM | POA: Diagnosis not present

## 2021-01-30 DIAGNOSIS — M791 Myalgia, unspecified site: Secondary | ICD-10-CM | POA: Diagnosis not present

## 2021-01-30 DIAGNOSIS — E1165 Type 2 diabetes mellitus with hyperglycemia: Secondary | ICD-10-CM | POA: Diagnosis not present

## 2021-01-30 DIAGNOSIS — I10 Essential (primary) hypertension: Secondary | ICD-10-CM | POA: Diagnosis not present

## 2021-01-30 DIAGNOSIS — E876 Hypokalemia: Secondary | ICD-10-CM | POA: Diagnosis not present

## 2021-01-30 DIAGNOSIS — G720 Drug-induced myopathy: Secondary | ICD-10-CM | POA: Diagnosis not present

## 2021-01-30 DIAGNOSIS — M25512 Pain in left shoulder: Secondary | ICD-10-CM | POA: Diagnosis not present

## 2021-01-30 DIAGNOSIS — E1122 Type 2 diabetes mellitus with diabetic chronic kidney disease: Secondary | ICD-10-CM | POA: Diagnosis not present

## 2021-01-30 DIAGNOSIS — E782 Mixed hyperlipidemia: Secondary | ICD-10-CM | POA: Diagnosis not present

## 2021-02-01 DIAGNOSIS — E119 Type 2 diabetes mellitus without complications: Secondary | ICD-10-CM | POA: Diagnosis not present

## 2021-02-01 DIAGNOSIS — M79671 Pain in right foot: Secondary | ICD-10-CM | POA: Diagnosis not present

## 2021-02-01 DIAGNOSIS — M79672 Pain in left foot: Secondary | ICD-10-CM | POA: Diagnosis not present

## 2021-02-01 DIAGNOSIS — B351 Tinea unguium: Secondary | ICD-10-CM | POA: Diagnosis not present

## 2021-02-06 ENCOUNTER — Encounter: Payer: Self-pay | Admitting: Neurology

## 2021-02-08 ENCOUNTER — Ambulatory Visit: Payer: HMO | Admitting: Neurology

## 2021-02-08 ENCOUNTER — Encounter: Payer: Self-pay | Admitting: Neurology

## 2021-02-08 VITALS — BP 135/72 | HR 55 | Ht 71.0 in | Wt 280.8 lb

## 2021-02-08 DIAGNOSIS — G4733 Obstructive sleep apnea (adult) (pediatric): Secondary | ICD-10-CM

## 2021-02-08 DIAGNOSIS — Z9989 Dependence on other enabling machines and devices: Secondary | ICD-10-CM

## 2021-02-08 NOTE — Progress Notes (Signed)
Subjective:    Glenn [name] ID: Glenn Glenn [name] is a 70 y.o. Glenn [name].  HPI    Interim history:   Glenn Glenn [name] is a 70 year old right-handed Glenn [name] with Glenn underlying medical history of hypertension, gout, edema, diabetes, anxiety and obesity, who presents for follow-up consultation of his obstructive sleep apnea, on treatment with AutoPap therapy.  Glenn Glenn [name] is accompanied by his wife today and presents for his 1 year checkup.  I first met him at Glenn request of his primary care physician on 12/27/2020, at which time Glenn Glenn [name] reported as a prior diagnosis of obstructive sleep apnea.  Glenn Glenn [name] had sleep testing through [facility] in 2014.  Glenn Glenn [name] had recently received a new AutoPap machine.  Glenn Glenn [name] was compliant with treatment and apnea scores looked good, but leak was quite high.  Glenn Glenn [name] was advised to get updated supplies and also Glenn correct size for his nasal pillows.  I did not suggest that Glenn Glenn [name] have another sleep study at Glenn time.  Today, 02/08/2021: I reviewed his AutoPap compliance data from 01/08/2021 through 02/06/2021, which is a total of 30 days, during which time Glenn Glenn [name] used his machine every night with percent use days greater than 4 hours at 100%, indicating superb compliance with Glenn average usage of 7 hours and 25 minutes, residual AHI at goal at 0.6/h, 95th percentile of pressure at 11.6 cm with a range of 10 to 14 cm with EPR, leak acceptable with a 95th percentile at 16.2 L/min, improved compared to last years download data.  Glenn Glenn [name] reports doing well.  Glenn Glenn [name] has been using P 10 nasal pillows, Glenn Glenn [name] is up-to-date with his supplies.  Glenn Glenn [name] has no acute concerns.  Glenn Glenn [name] had some change in his diabetes management, latest A1c by his report was 7.0 and Glenn Glenn [name] was taken off of glipizide.  Glenn Glenn [name] has furosemide as needed for swelling and takes potassium 10 meq if Glenn Glenn [name] takes Glenn furosemide.  Glenn Glenn [name] has a physical pending for November.  Glenn Glenn [name] sees his eye doctor regularly.  Glenn Glenn [name] sees dermatology regularly, usually twice a year, Glenn Glenn [name] has had basal cell cancers  removed from his scalp.  Glenn Glenn [name]'s allergies, current medications, family history, past medical history, past social history, past surgical history and problem list were reviewed and updated as appropriate.   Previously:   12/27/20: (Glenn Glenn [name]) was previously diagnosed with obstructive sleep apnea and placed on PAP therapy.  Sleep study testing was through [facility] about 7 years ago. Glenn Glenn [name] brought a copy of his sleep study results from 09/29/2012.  Glenn Glenn [name] was diagnosed with severe obstructive sleep apnea, AHI was 51.1/h, O2 nadir was 68%.  Glenn Glenn [name] has been on AutoPap therapy.  Glenn Glenn [name] had a Respironics machine until June, but after Glenn recall on Glenn machine Glenn Glenn [name] has established on Glenn ResMed air sense 10 auto.  Glenn Glenn [name] is completely compliant with treatment.  In Glenn past 20 days, after Glenn Glenn [name] started his new machine on 12/09/2019, Glenn Glenn [name] has used his machine every night.  His pressure is minimum pressure of 10 cm, maximum of 14, 95th percentile is 11.9 cm, leak is fairly consistently on Glenn high side with a 95th percentile at 40.4 L/min, average AHI at goal at 2/h.  On his previous machine Glenn Glenn [name] was also fully compliant with treatment with percent use days greater than 4 hours at 96.7% for Glenn month of 11/04/2019 through 12/03/2019. I reviewed your office note from 11/30/2019.  His Epworth sleepiness score is 4 out of 24, fatigue severity score is 20 out of 63. Glenn Glenn [name] reports being fully  compliant with treatment since Glenn Glenn [name] received his original machine.  Glenn Glenn [name] was previously followed by [facility] for sleep and for primary care but change providers.  Glenn Glenn [name] reports no issues with his AutoPap machine, Glenn Glenn [name] uses nasal pillows, typically a medium but currently has a large.  Glenn Glenn [name] does have updated supplies.  His DME company is [employer].  Glenn Glenn [name] lives with his wife, retired as a Licensed conveyancer.  They have 2 grown children and 4 grandchildren.  Glenn Glenn [name] drinks caffeine occasionally in Glenn form of tea, Glenn Glenn [name] does not utilize alcohol, non-smoker.  Bedtime is generally around 11 and  rise time between 5 and 5:15 AM typically.  They have 2 dogs in Glenn household.  Glenn Glenn [name] had a tonsillectomy as a child.  Glenn Glenn [name] denies nocturia but used to have nighttime urination before Glenn Glenn [name] was diagnosed with sleep apnea.  Glenn Glenn [name] denies recurrent morning headaches.  His Past Medical History Is Significant For: Past Medical History:  Diagnosis Date   Diabetes ([place])    Hypertension    Migraine    OSA (obstructive sleep apnea)     His Past Surgical History Is Significant For: Past Surgical History:  Procedure Laterality Date   CATARACT EXTRACTION     MOHS SURGERY     x2   Quadracept repair     TONSILLECTOMY      His Family History Is Significant For: Family History  Problem Relation Age of Onset   Bladder Cancer Mother    Kidney failure Father    Prostate cancer Father    Colon cancer Father    Prostate cancer Maternal Grandfather    Prostate cancer Paternal Grandfather     His Social History Is Significant For: Social History   Socioeconomic History   Marital status: Married    Spouse name: Not on file   Number of children: Not on file   Years of education: Not on file   Highest education level: Not on file  Occupational History   Not on file  Tobacco Use   Smoking status: Never   Smokeless tobacco: Never  Vaping Use   Vaping Use: Never used  Substance and Sexual Activity   Alcohol use: Never   Drug use: Never   Sexual activity: Not on file  Other Topics Concern   Not on file  Social History Narrative   Not on file   Social Determinants of Health   Financial Resource Strain: Not on file  Food Insecurity: No Food Insecurity   Worried About Running Out of Food in Glenn Last Year: Never true   [place] in Glenn Last Year: Never true  Transportation Needs: No Transportation Needs   Lack of Transportation (Medical): No   Lack of Transportation (Non-Medical): No  Physical Activity: Not on file  Stress: Not on file  Social Connections: Not on file    His Allergies  Are:  Allergies  Allergen Reactions   Trulicity [Dulaglutide]     Severe abdominal pain   :   His Current Medications Are:  Outpatient Encounter Medications as of 02/08/2021  Medication Sig   allopurinol (ZYLOPRIM) 100 MG tablet Take 100 mg by mouth daily.   aspirin EC 81 MG tablet Take 81 mg by mouth daily. Swallow whole.   azelastine (ASTELIN) 0.1 % nasal spray Place 2 sprays into both nostrils 2 (two) times daily.   cholecalciferol (VITAMIN D3) 25 MCG (1000 UNIT) tablet Take 2,000 Units by mouth daily.   cloNIDine (CATAPRES) 0.1 MG tablet Take by mouth.  colchicine 0.6 MG tablet Take by mouth.   nateglinide (STARLIX) 120 MG tablet Take 120 mg by mouth 3 (three) times daily.   spironolactone (ALDACTONE) 25 MG tablet Take 25 mg by mouth 2 (two) times daily.   telmisartan (MICARDIS) 80 MG tablet Take 80 mg by mouth daily.   traMADol (ULTRAM) 50 MG tablet Take 1 tablet (50 mg total) by mouth every 8 (eight) hours as needed for up to 20 doses.   TRESIBA FLEXTOUCH 200 UNIT/ML FlexTouch Pen 40 Units.    triamterene-hydrochlorothiazide (MAXZIDE-25) 37.5-25 MG tablet Take 1 tablet by mouth every morning.   [DISCONTINUED] glipiZIDE (GLUCOTROL) 10 MG tablet Take 10 mg by mouth 2 (two) times daily.   No facility-administered encounter medications on file as of 02/08/2021.  :  Review of Systems:  Out of a complete 14 point review of systems, all are reviewed and negative with Glenn exception of these symptoms as listed below:  Review of Systems  Neurological:        Pt states no issues or complaints with CPAP. Pt states things are going well  ESS:4 FSS;10   Objective:  Neurological Exam  Physical Exam Physical Examination:   Vitals:   02/08/21 1008  BP: 135/72  Pulse: (!) 55    General Examination: Glenn Glenn [name] is a very pleasant 70 y.o. Glenn [name] in no acute distress. Glenn Glenn [name] appears well-developed and well-nourished and well groomed.   HEENT: Normocephalic, atraumatic, pupils are equal,  round and reactive to light, extraocular tracking is good without limitation to gaze excursion or nystagmus noted. Hearing is grossly intact.  Status post cataract repairs.  Face is symmetric with normal facial animation. Speech is clear with no dysarthria noted. There is no hypophonia. There is no lip, neck/head, jaw or voice tremor. Neck is supple with full range of passive and active motion. There are no carotid bruits on auscultation. Oropharynx exam reveals: mild mouth dryness, adequate dental hygiene and mild airway crowding. Tongue protrudes centrally and palate elevates symmetrically.    Chest: Clear to auscultation without wheezing, rhonchi or crackles noted.   Heart: S1+S2+0, regular and normal without murmurs, rubs or gallops noted.    Abdomen: Soft, non-tender and non-distended.   Extremities: There is no pitting edema in Glenn distal lower extremities bilaterally.    Skin: Warm and dry without trophic changes noted.    Musculoskeletal: exam reveals no obvious joint deformities.    Neurologically:  Mental status: Glenn Glenn [name] is awake, alert and oriented in all 4 spheres. His immediate and remote memory, attention, language skills and fund of knowledge are appropriate. There is no evidence of aphasia, agnosia, apraxia or anomia. Speech is clear with normal prosody and enunciation. Thought process is linear. Mood is normal and affect is normal.  Cranial nerves II - XII are as described above under HEENT exam.  Motor exam: Normal bulk, strength and tone is noted. There is no tremor, fine motor skills and coordination: grossly intact.  Cerebellar testing: No dysmetria or intention tremor. There is no truncal or gait ataxia.  Sensory exam: intact to light touch in Glenn upper and lower extremities.  Gait, station and balance: Glenn Glenn [name] stands easily. No veering to one side is noted. No leaning to one side is noted. Posture is age-appropriate and stance is narrow based. Gait shows normal stride length  and normal pace. No problems turning are noted.  Assessment and Plan:    In summary, Glenn Glenn [name] is a very pleasant 70 year old Glenn [name] with Glenn underlying  medical history of hypertension, gout, edema, diabetes, anxiety and obesity, who presents for follow-up consultation of his obstructive sleep apnea.  Glenn Glenn [name] was originally diagnosed in 2014, Glenn Glenn [name] had severe sleep apnea.  Glenn Glenn [name] has been on AutoPap therapy.  Glenn Glenn [name] had a Respironics machine before.  Glenn Glenn [name] is compliant with treatment and is commended for his treatment adherence.  Glenn Glenn [name] is typically up-to-date with his supplies.  Glenn Glenn [name] continues to benefit from treatment and is at Glenn point advised to follow-up routinely to see one of our nurse practitioners in 1 year.  We can offer a MyChart video visit if Glenn need arises.  I answered all his questions today and Glenn Glenn [name] and his wife were in agreement with Glenn plan. I spent 30 minutes in total face-to-face time and in reviewing records during pre-charting, more than 50% of which was spent in counseling and coordination of care, reviewing test results, reviewing medications and treatment regimen and/or in discussing or reviewing Glenn diagnosis of OSA, Glenn prognosis and treatment options. Pertinent laboratory and imaging test results that were available during Glenn visit with Glenn Glenn [name] were reviewed by me and considered in my medical decision making (see chart for details).

## 2021-02-08 NOTE — Patient Instructions (Signed)
It was nice to see you again today.  I am glad your autoPAP is working well, you are fully compliant with treatment. Keep up the good work! Please continue using your autoPAP regularly. While your insurance requires that you use PAP at least 4 hours each night on 70% of the nights, I recommend, that you not skip any nights and use it throughout the night if you can. Getting used to PAP and staying with the treatment long term does take time and patience and discipline. Untreated obstructive sleep apnea when it is moderate to severe can have an adverse impact on cardiovascular health and raise her risk for heart disease, arrhythmias, hypertension, congestive heart failure, stroke and diabetes. Untreated obstructive sleep apnea causes sleep disruption, nonrestorative sleep, and sleep deprivation. This can have an impact on your day to day functioning and cause daytime sleepiness and impairment of cognitive function, memory loss, mood disturbance, and problems focussing. Using PAP regularly can improve these symptoms.  We can see you in 1 year, you can see one of our nurse practitioners as you are stable.  We can offer you a MyChart video visit at the time if you prefer.

## 2021-02-14 DIAGNOSIS — E162 Hypoglycemia, unspecified: Secondary | ICD-10-CM | POA: Diagnosis not present

## 2021-02-14 DIAGNOSIS — E1165 Type 2 diabetes mellitus with hyperglycemia: Secondary | ICD-10-CM | POA: Diagnosis not present

## 2021-03-08 DIAGNOSIS — Z23 Encounter for immunization: Secondary | ICD-10-CM | POA: Diagnosis not present

## 2021-03-20 DIAGNOSIS — E11319 Type 2 diabetes mellitus with unspecified diabetic retinopathy without macular edema: Secondary | ICD-10-CM | POA: Diagnosis not present

## 2021-03-20 DIAGNOSIS — E1159 Type 2 diabetes mellitus with other circulatory complications: Secondary | ICD-10-CM | POA: Diagnosis not present

## 2021-03-20 DIAGNOSIS — E1169 Type 2 diabetes mellitus with other specified complication: Secondary | ICD-10-CM | POA: Diagnosis not present

## 2021-03-20 DIAGNOSIS — E1121 Type 2 diabetes mellitus with diabetic nephropathy: Secondary | ICD-10-CM | POA: Diagnosis not present

## 2021-03-20 DIAGNOSIS — E782 Mixed hyperlipidemia: Secondary | ICD-10-CM | POA: Diagnosis not present

## 2021-03-20 DIAGNOSIS — E1122 Type 2 diabetes mellitus with diabetic chronic kidney disease: Secondary | ICD-10-CM | POA: Diagnosis not present

## 2021-03-20 DIAGNOSIS — I152 Hypertension secondary to endocrine disorders: Secondary | ICD-10-CM | POA: Diagnosis not present

## 2021-03-20 DIAGNOSIS — E1165 Type 2 diabetes mellitus with hyperglycemia: Secondary | ICD-10-CM | POA: Diagnosis not present

## 2021-03-20 DIAGNOSIS — E1143 Type 2 diabetes mellitus with diabetic autonomic (poly)neuropathy: Secondary | ICD-10-CM | POA: Diagnosis not present

## 2021-03-20 DIAGNOSIS — I129 Hypertensive chronic kidney disease with stage 1 through stage 4 chronic kidney disease, or unspecified chronic kidney disease: Secondary | ICD-10-CM | POA: Diagnosis not present

## 2021-04-10 DIAGNOSIS — L821 Other seborrheic keratosis: Secondary | ICD-10-CM | POA: Diagnosis not present

## 2021-04-10 DIAGNOSIS — Z85828 Personal history of other malignant neoplasm of skin: Secondary | ICD-10-CM | POA: Diagnosis not present

## 2021-04-10 DIAGNOSIS — L570 Actinic keratosis: Secondary | ICD-10-CM | POA: Diagnosis not present

## 2021-04-10 DIAGNOSIS — L578 Other skin changes due to chronic exposure to nonionizing radiation: Secondary | ICD-10-CM | POA: Diagnosis not present

## 2021-04-10 DIAGNOSIS — L820 Inflamed seborrheic keratosis: Secondary | ICD-10-CM | POA: Diagnosis not present

## 2021-04-24 DIAGNOSIS — M7542 Impingement syndrome of left shoulder: Secondary | ICD-10-CM | POA: Diagnosis not present

## 2021-04-24 DIAGNOSIS — J329 Chronic sinusitis, unspecified: Secondary | ICD-10-CM | POA: Diagnosis not present

## 2021-04-24 DIAGNOSIS — B9689 Other specified bacterial agents as the cause of diseases classified elsewhere: Secondary | ICD-10-CM | POA: Diagnosis not present

## 2021-04-24 DIAGNOSIS — I10 Essential (primary) hypertension: Secondary | ICD-10-CM | POA: Diagnosis not present

## 2021-04-24 DIAGNOSIS — J069 Acute upper respiratory infection, unspecified: Secondary | ICD-10-CM | POA: Diagnosis not present

## 2021-04-24 DIAGNOSIS — G4733 Obstructive sleep apnea (adult) (pediatric): Secondary | ICD-10-CM | POA: Diagnosis not present

## 2021-04-28 DIAGNOSIS — E1165 Type 2 diabetes mellitus with hyperglycemia: Secondary | ICD-10-CM | POA: Diagnosis not present

## 2021-04-28 DIAGNOSIS — I10 Essential (primary) hypertension: Secondary | ICD-10-CM | POA: Diagnosis not present

## 2021-05-01 DIAGNOSIS — M25512 Pain in left shoulder: Secondary | ICD-10-CM | POA: Diagnosis not present

## 2021-05-01 DIAGNOSIS — M6281 Muscle weakness (generalized): Secondary | ICD-10-CM | POA: Diagnosis not present

## 2021-05-01 DIAGNOSIS — M7542 Impingement syndrome of left shoulder: Secondary | ICD-10-CM | POA: Diagnosis not present

## 2021-05-01 DIAGNOSIS — M25612 Stiffness of left shoulder, not elsewhere classified: Secondary | ICD-10-CM | POA: Diagnosis not present

## 2021-05-02 DIAGNOSIS — E1165 Type 2 diabetes mellitus with hyperglycemia: Secondary | ICD-10-CM | POA: Diagnosis not present

## 2021-05-02 DIAGNOSIS — E1159 Type 2 diabetes mellitus with other circulatory complications: Secondary | ICD-10-CM | POA: Diagnosis not present

## 2021-05-02 DIAGNOSIS — E1143 Type 2 diabetes mellitus with diabetic autonomic (poly)neuropathy: Secondary | ICD-10-CM | POA: Diagnosis not present

## 2021-05-02 DIAGNOSIS — E782 Mixed hyperlipidemia: Secondary | ICD-10-CM | POA: Diagnosis not present

## 2021-05-02 DIAGNOSIS — I152 Hypertension secondary to endocrine disorders: Secondary | ICD-10-CM | POA: Diagnosis not present

## 2021-05-02 DIAGNOSIS — I10 Essential (primary) hypertension: Secondary | ICD-10-CM | POA: Diagnosis not present

## 2021-05-02 DIAGNOSIS — E1121 Type 2 diabetes mellitus with diabetic nephropathy: Secondary | ICD-10-CM | POA: Diagnosis not present

## 2021-05-02 DIAGNOSIS — I129 Hypertensive chronic kidney disease with stage 1 through stage 4 chronic kidney disease, or unspecified chronic kidney disease: Secondary | ICD-10-CM | POA: Diagnosis not present

## 2021-05-02 DIAGNOSIS — E1122 Type 2 diabetes mellitus with diabetic chronic kidney disease: Secondary | ICD-10-CM | POA: Diagnosis not present

## 2021-05-02 DIAGNOSIS — E11319 Type 2 diabetes mellitus with unspecified diabetic retinopathy without macular edema: Secondary | ICD-10-CM | POA: Diagnosis not present

## 2021-05-02 DIAGNOSIS — E1169 Type 2 diabetes mellitus with other specified complication: Secondary | ICD-10-CM | POA: Diagnosis not present

## 2021-05-03 DIAGNOSIS — M79671 Pain in right foot: Secondary | ICD-10-CM | POA: Diagnosis not present

## 2021-05-03 DIAGNOSIS — B351 Tinea unguium: Secondary | ICD-10-CM | POA: Diagnosis not present

## 2021-05-03 DIAGNOSIS — E119 Type 2 diabetes mellitus without complications: Secondary | ICD-10-CM | POA: Diagnosis not present

## 2021-05-03 DIAGNOSIS — M79672 Pain in left foot: Secondary | ICD-10-CM | POA: Diagnosis not present

## 2021-05-08 DIAGNOSIS — M25612 Stiffness of left shoulder, not elsewhere classified: Secondary | ICD-10-CM | POA: Diagnosis not present

## 2021-05-08 DIAGNOSIS — M25512 Pain in left shoulder: Secondary | ICD-10-CM | POA: Diagnosis not present

## 2021-05-08 DIAGNOSIS — M6281 Muscle weakness (generalized): Secondary | ICD-10-CM | POA: Diagnosis not present

## 2021-05-08 DIAGNOSIS — M7542 Impingement syndrome of left shoulder: Secondary | ICD-10-CM | POA: Diagnosis not present

## 2021-05-10 DIAGNOSIS — M6281 Muscle weakness (generalized): Secondary | ICD-10-CM | POA: Diagnosis not present

## 2021-05-10 DIAGNOSIS — M25512 Pain in left shoulder: Secondary | ICD-10-CM | POA: Diagnosis not present

## 2021-05-10 DIAGNOSIS — M25612 Stiffness of left shoulder, not elsewhere classified: Secondary | ICD-10-CM | POA: Diagnosis not present

## 2021-05-10 DIAGNOSIS — M7542 Impingement syndrome of left shoulder: Secondary | ICD-10-CM | POA: Diagnosis not present

## 2021-05-11 DIAGNOSIS — Z0000 Encounter for general adult medical examination without abnormal findings: Secondary | ICD-10-CM | POA: Diagnosis not present

## 2021-05-11 DIAGNOSIS — Z1331 Encounter for screening for depression: Secondary | ICD-10-CM | POA: Diagnosis not present

## 2021-05-11 DIAGNOSIS — Z1339 Encounter for screening examination for other mental health and behavioral disorders: Secondary | ICD-10-CM | POA: Diagnosis not present

## 2021-05-18 DIAGNOSIS — M7542 Impingement syndrome of left shoulder: Secondary | ICD-10-CM | POA: Diagnosis not present

## 2021-05-18 DIAGNOSIS — M25512 Pain in left shoulder: Secondary | ICD-10-CM | POA: Diagnosis not present

## 2021-05-18 DIAGNOSIS — M25612 Stiffness of left shoulder, not elsewhere classified: Secondary | ICD-10-CM | POA: Diagnosis not present

## 2021-05-18 DIAGNOSIS — M6281 Muscle weakness (generalized): Secondary | ICD-10-CM | POA: Diagnosis not present

## 2021-05-22 DIAGNOSIS — M7542 Impingement syndrome of left shoulder: Secondary | ICD-10-CM | POA: Diagnosis not present

## 2021-05-22 DIAGNOSIS — M6281 Muscle weakness (generalized): Secondary | ICD-10-CM | POA: Diagnosis not present

## 2021-05-22 DIAGNOSIS — M25612 Stiffness of left shoulder, not elsewhere classified: Secondary | ICD-10-CM | POA: Diagnosis not present

## 2021-05-22 DIAGNOSIS — M25512 Pain in left shoulder: Secondary | ICD-10-CM | POA: Diagnosis not present

## 2021-05-24 DIAGNOSIS — I10 Essential (primary) hypertension: Secondary | ICD-10-CM | POA: Diagnosis not present

## 2021-05-24 DIAGNOSIS — G43909 Migraine, unspecified, not intractable, without status migrainosus: Secondary | ICD-10-CM | POA: Diagnosis not present

## 2021-05-24 DIAGNOSIS — M6289 Other specified disorders of muscle: Secondary | ICD-10-CM | POA: Diagnosis not present

## 2021-05-24 DIAGNOSIS — M7542 Impingement syndrome of left shoulder: Secondary | ICD-10-CM | POA: Diagnosis not present

## 2021-05-30 DIAGNOSIS — M7542 Impingement syndrome of left shoulder: Secondary | ICD-10-CM | POA: Diagnosis not present

## 2021-05-30 DIAGNOSIS — M25512 Pain in left shoulder: Secondary | ICD-10-CM | POA: Diagnosis not present

## 2021-05-30 DIAGNOSIS — M25612 Stiffness of left shoulder, not elsewhere classified: Secondary | ICD-10-CM | POA: Diagnosis not present

## 2021-05-30 DIAGNOSIS — M6281 Muscle weakness (generalized): Secondary | ICD-10-CM | POA: Diagnosis not present

## 2021-06-01 DIAGNOSIS — M6281 Muscle weakness (generalized): Secondary | ICD-10-CM | POA: Diagnosis not present

## 2021-06-01 DIAGNOSIS — M25612 Stiffness of left shoulder, not elsewhere classified: Secondary | ICD-10-CM | POA: Diagnosis not present

## 2021-06-01 DIAGNOSIS — M25512 Pain in left shoulder: Secondary | ICD-10-CM | POA: Diagnosis not present

## 2021-06-01 DIAGNOSIS — M7542 Impingement syndrome of left shoulder: Secondary | ICD-10-CM | POA: Diagnosis not present

## 2021-06-06 DIAGNOSIS — M6281 Muscle weakness (generalized): Secondary | ICD-10-CM | POA: Diagnosis not present

## 2021-06-06 DIAGNOSIS — M7542 Impingement syndrome of left shoulder: Secondary | ICD-10-CM | POA: Diagnosis not present

## 2021-06-06 DIAGNOSIS — M25612 Stiffness of left shoulder, not elsewhere classified: Secondary | ICD-10-CM | POA: Diagnosis not present

## 2021-06-06 DIAGNOSIS — M25512 Pain in left shoulder: Secondary | ICD-10-CM | POA: Diagnosis not present

## 2021-06-07 DIAGNOSIS — L570 Actinic keratosis: Secondary | ICD-10-CM | POA: Diagnosis not present

## 2021-06-09 DIAGNOSIS — M25512 Pain in left shoulder: Secondary | ICD-10-CM | POA: Diagnosis not present

## 2021-06-09 DIAGNOSIS — M6281 Muscle weakness (generalized): Secondary | ICD-10-CM | POA: Diagnosis not present

## 2021-06-09 DIAGNOSIS — M7542 Impingement syndrome of left shoulder: Secondary | ICD-10-CM | POA: Diagnosis not present

## 2021-06-09 DIAGNOSIS — M25612 Stiffness of left shoulder, not elsewhere classified: Secondary | ICD-10-CM | POA: Diagnosis not present

## 2021-06-15 DIAGNOSIS — M25612 Stiffness of left shoulder, not elsewhere classified: Secondary | ICD-10-CM | POA: Diagnosis not present

## 2021-06-15 DIAGNOSIS — M6281 Muscle weakness (generalized): Secondary | ICD-10-CM | POA: Diagnosis not present

## 2021-06-15 DIAGNOSIS — M25512 Pain in left shoulder: Secondary | ICD-10-CM | POA: Diagnosis not present

## 2021-06-15 DIAGNOSIS — M7542 Impingement syndrome of left shoulder: Secondary | ICD-10-CM | POA: Diagnosis not present

## 2021-06-23 DIAGNOSIS — M25612 Stiffness of left shoulder, not elsewhere classified: Secondary | ICD-10-CM | POA: Diagnosis not present

## 2021-06-23 DIAGNOSIS — M6281 Muscle weakness (generalized): Secondary | ICD-10-CM | POA: Diagnosis not present

## 2021-06-23 DIAGNOSIS — M7542 Impingement syndrome of left shoulder: Secondary | ICD-10-CM | POA: Diagnosis not present

## 2021-06-23 DIAGNOSIS — M25512 Pain in left shoulder: Secondary | ICD-10-CM | POA: Diagnosis not present

## 2021-06-29 DIAGNOSIS — M25612 Stiffness of left shoulder, not elsewhere classified: Secondary | ICD-10-CM | POA: Diagnosis not present

## 2021-06-29 DIAGNOSIS — M6281 Muscle weakness (generalized): Secondary | ICD-10-CM | POA: Diagnosis not present

## 2021-06-29 DIAGNOSIS — M7542 Impingement syndrome of left shoulder: Secondary | ICD-10-CM | POA: Diagnosis not present

## 2021-06-29 DIAGNOSIS — M25512 Pain in left shoulder: Secondary | ICD-10-CM | POA: Diagnosis not present

## 2021-07-04 DIAGNOSIS — M25612 Stiffness of left shoulder, not elsewhere classified: Secondary | ICD-10-CM | POA: Diagnosis not present

## 2021-07-04 DIAGNOSIS — M25512 Pain in left shoulder: Secondary | ICD-10-CM | POA: Diagnosis not present

## 2021-07-04 DIAGNOSIS — I10 Essential (primary) hypertension: Secondary | ICD-10-CM | POA: Diagnosis not present

## 2021-07-04 DIAGNOSIS — E11649 Type 2 diabetes mellitus with hypoglycemia without coma: Secondary | ICD-10-CM | POA: Diagnosis not present

## 2021-07-04 DIAGNOSIS — M6281 Muscle weakness (generalized): Secondary | ICD-10-CM | POA: Diagnosis not present

## 2021-07-04 DIAGNOSIS — R739 Hyperglycemia, unspecified: Secondary | ICD-10-CM | POA: Diagnosis not present

## 2021-07-04 DIAGNOSIS — M109 Gout, unspecified: Secondary | ICD-10-CM | POA: Diagnosis not present

## 2021-07-04 DIAGNOSIS — M7542 Impingement syndrome of left shoulder: Secondary | ICD-10-CM | POA: Diagnosis not present

## 2021-07-06 DIAGNOSIS — M25612 Stiffness of left shoulder, not elsewhere classified: Secondary | ICD-10-CM | POA: Diagnosis not present

## 2021-07-06 DIAGNOSIS — M25512 Pain in left shoulder: Secondary | ICD-10-CM | POA: Diagnosis not present

## 2021-07-06 DIAGNOSIS — M7542 Impingement syndrome of left shoulder: Secondary | ICD-10-CM | POA: Diagnosis not present

## 2021-07-06 DIAGNOSIS — M6281 Muscle weakness (generalized): Secondary | ICD-10-CM | POA: Diagnosis not present

## 2021-07-18 DIAGNOSIS — G4733 Obstructive sleep apnea (adult) (pediatric): Secondary | ICD-10-CM | POA: Diagnosis not present

## 2021-08-02 DIAGNOSIS — B351 Tinea unguium: Secondary | ICD-10-CM | POA: Diagnosis not present

## 2021-08-02 DIAGNOSIS — M79672 Pain in left foot: Secondary | ICD-10-CM | POA: Diagnosis not present

## 2021-08-02 DIAGNOSIS — M79671 Pain in right foot: Secondary | ICD-10-CM | POA: Diagnosis not present

## 2021-08-02 DIAGNOSIS — E119 Type 2 diabetes mellitus without complications: Secondary | ICD-10-CM | POA: Diagnosis not present

## 2021-08-04 DIAGNOSIS — I10 Essential (primary) hypertension: Secondary | ICD-10-CM | POA: Diagnosis not present

## 2021-08-04 DIAGNOSIS — E11649 Type 2 diabetes mellitus with hypoglycemia without coma: Secondary | ICD-10-CM | POA: Diagnosis not present

## 2021-08-04 DIAGNOSIS — E559 Vitamin D deficiency, unspecified: Secondary | ICD-10-CM | POA: Diagnosis not present

## 2021-08-08 DIAGNOSIS — I10 Essential (primary) hypertension: Secondary | ICD-10-CM | POA: Diagnosis not present

## 2021-08-08 DIAGNOSIS — E1121 Type 2 diabetes mellitus with diabetic nephropathy: Secondary | ICD-10-CM | POA: Diagnosis not present

## 2021-08-08 DIAGNOSIS — E559 Vitamin D deficiency, unspecified: Secondary | ICD-10-CM | POA: Diagnosis not present

## 2021-08-08 DIAGNOSIS — E1165 Type 2 diabetes mellitus with hyperglycemia: Secondary | ICD-10-CM | POA: Diagnosis not present

## 2021-08-08 DIAGNOSIS — K219 Gastro-esophageal reflux disease without esophagitis: Secondary | ICD-10-CM | POA: Diagnosis not present

## 2021-08-31 IMAGING — CT CT SHOULDER*L* W/O CM
1 of 2 series · 9 of 14 positions shown, 12 images · non-contrast
Comparison: X-ray 11/26/2020

CLINICAL DATA: Left shoulder pain after fall on 11/26/2020

EXAM:
CT OF THE UPPER LEFT EXTREMITY WITHOUT CONTRAST
TECHNIQUE: Multidetector CT imaging of the upper left extremity was performed
according to the standard protocol.

[Series 14: shoulder 0.60 br40 s3 thins soft · axial · 0.57mm/px · z∈[-848,-675]mm · 9 of 362 slices shown, 12 images]
[im 37/362  soft-tissue]
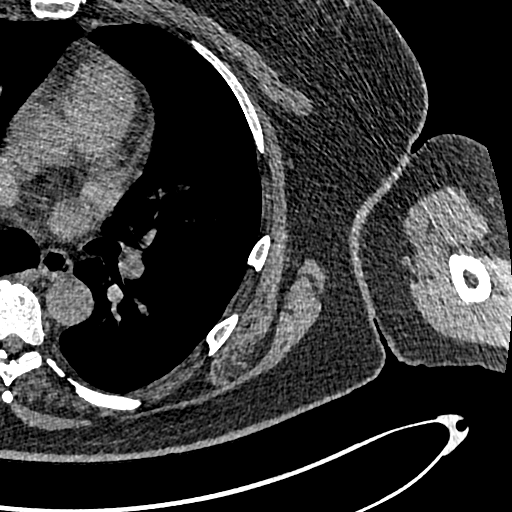
[im 37/362  bone]
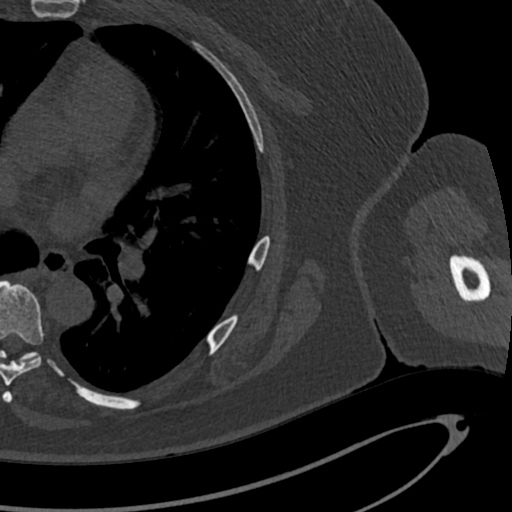
[im 73/362  bone]
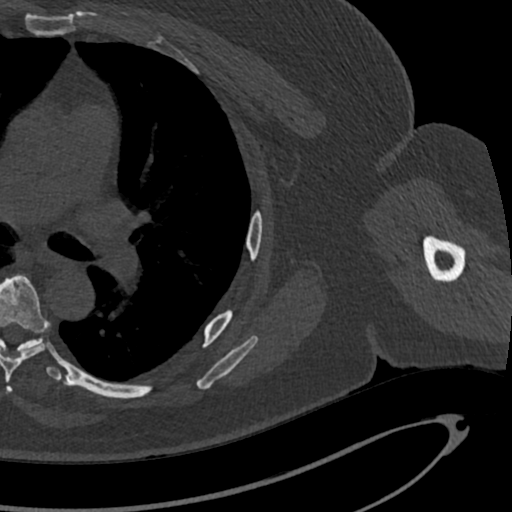
[im 109/362  bone]
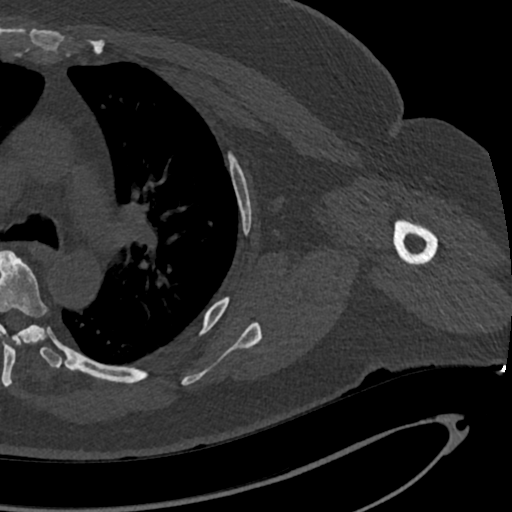
[im 145/362  bone]
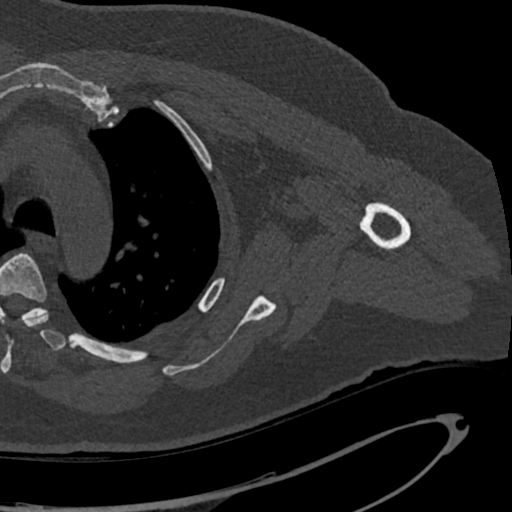
[im 181/362  soft-tissue]
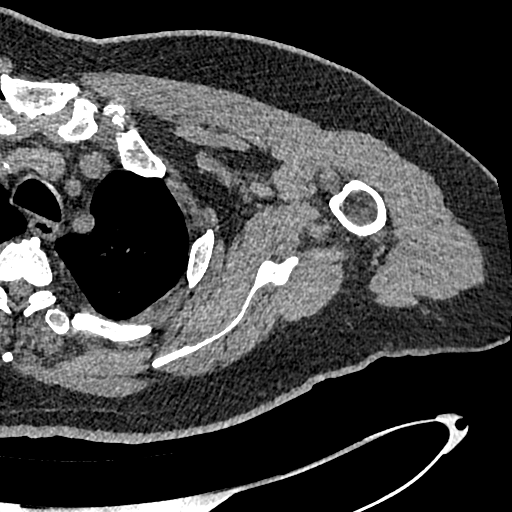
[im 181/362  bone]
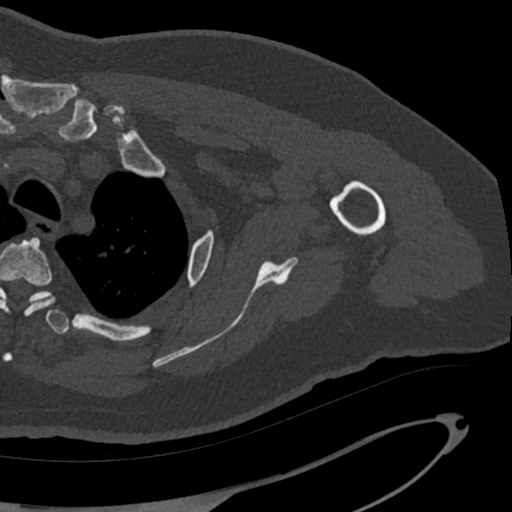
[im 217/362  bone]
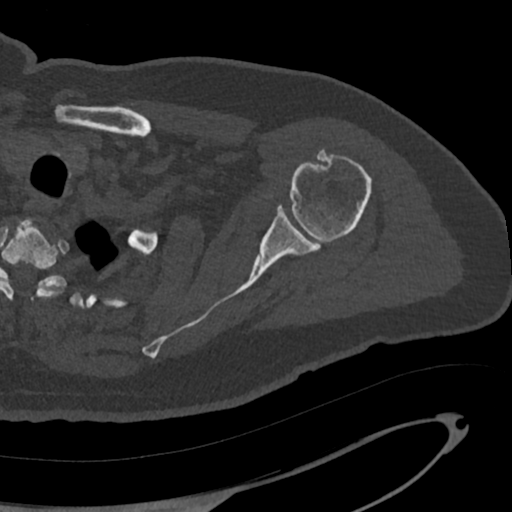
[im 253/362  bone]
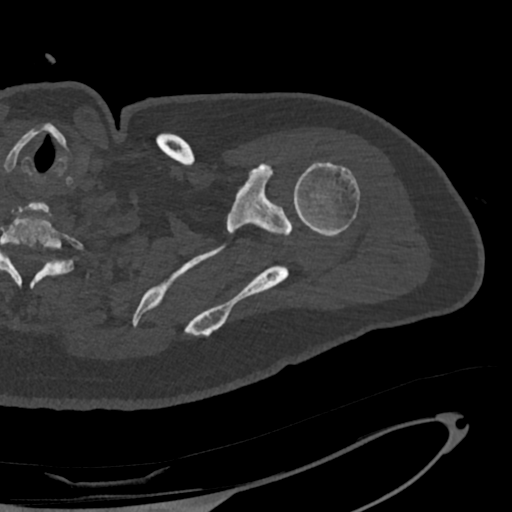
[im 289/362  bone]
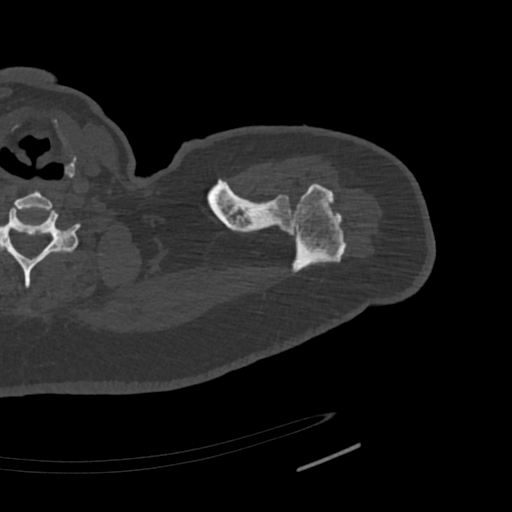
[im 325/362  soft-tissue]
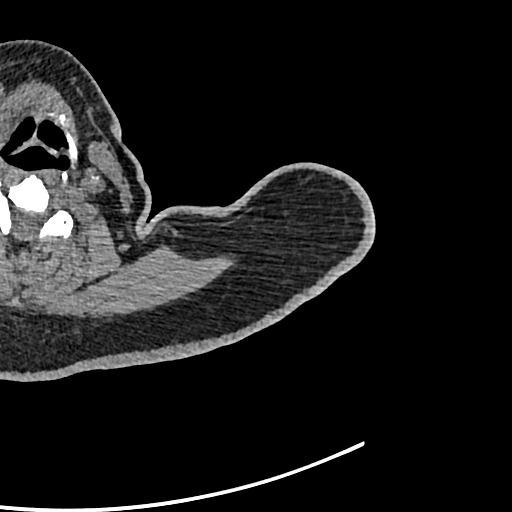
[im 325/362  bone]
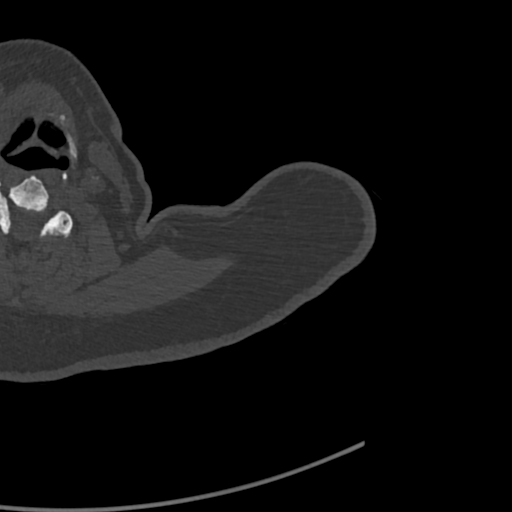

[9 of 14 positions shown; findings below may reference images not displayed]

FINDINGS: Bones/Joint/Cartilage

No acute fracture. No dislocation. Mild glenohumeral joint
osteoarthritis manifested by joint space narrowing and marginal
osteophyte formation. Scattered subcortical cystic changes within
the humeral head, primarily at the greater tuberosity. No
glenohumeral joint effusion is evident.

Moderate degenerative changes of the acromioclavicular joint. No
large subacromial-subdeltoid bursal fluid collection. Degenerative
facet arthropathy is seen within the visualized cervical spine.
Degenerative endplate spurring throughout the visualized thoracic
spine. Remaining visualized osseous structures appear grossly
intact.

Ligaments

Suboptimally assessed by CT.

Muscles and Tendons

Preserved muscle bulk of the rotator cuff musculature without
significant atrophy or fatty infiltration. Rotator cuff tendons
appear grossly intact within the limitations of CT.

Soft tissues

No soft tissue fluid collection or hematoma. No axillary
lymphadenopathy. Visualized lung field is clear.
IMPRESSION: 1. No acute osseous abnormality of the left shoulder.
2. Mild glenohumeral and moderate acromioclavicular joint
osteoarthritis.

## 2021-09-05 DIAGNOSIS — M25569 Pain in unspecified knee: Secondary | ICD-10-CM | POA: Diagnosis not present

## 2021-09-05 DIAGNOSIS — M109 Gout, unspecified: Secondary | ICD-10-CM | POA: Diagnosis not present

## 2021-09-05 DIAGNOSIS — K219 Gastro-esophageal reflux disease without esophagitis: Secondary | ICD-10-CM | POA: Diagnosis not present

## 2021-09-05 DIAGNOSIS — E1165 Type 2 diabetes mellitus with hyperglycemia: Secondary | ICD-10-CM | POA: Diagnosis not present

## 2021-09-17 DIAGNOSIS — U071 COVID-19: Secondary | ICD-10-CM | POA: Diagnosis not present

## 2021-10-12 DIAGNOSIS — M25562 Pain in left knee: Secondary | ICD-10-CM | POA: Diagnosis not present

## 2021-10-16 DIAGNOSIS — M109 Gout, unspecified: Secondary | ICD-10-CM | POA: Diagnosis not present

## 2021-10-17 DIAGNOSIS — I10 Essential (primary) hypertension: Secondary | ICD-10-CM | POA: Diagnosis not present

## 2021-10-17 DIAGNOSIS — M109 Gout, unspecified: Secondary | ICD-10-CM | POA: Diagnosis not present

## 2021-10-30 DIAGNOSIS — M79672 Pain in left foot: Secondary | ICD-10-CM | POA: Diagnosis not present

## 2021-10-30 DIAGNOSIS — B351 Tinea unguium: Secondary | ICD-10-CM | POA: Diagnosis not present

## 2021-10-30 DIAGNOSIS — M79671 Pain in right foot: Secondary | ICD-10-CM | POA: Diagnosis not present

## 2021-10-30 DIAGNOSIS — E119 Type 2 diabetes mellitus without complications: Secondary | ICD-10-CM | POA: Diagnosis not present

## 2021-11-14 DIAGNOSIS — M1712 Unilateral primary osteoarthritis, left knee: Secondary | ICD-10-CM | POA: Diagnosis not present

## 2021-12-04 DIAGNOSIS — H43813 Vitreous degeneration, bilateral: Secondary | ICD-10-CM | POA: Diagnosis not present

## 2021-12-04 DIAGNOSIS — D3132 Benign neoplasm of left choroid: Secondary | ICD-10-CM | POA: Diagnosis not present

## 2021-12-04 DIAGNOSIS — H26491 Other secondary cataract, right eye: Secondary | ICD-10-CM | POA: Diagnosis not present

## 2021-12-04 DIAGNOSIS — G43801 Other migraine, not intractable, with status migrainosus: Secondary | ICD-10-CM | POA: Diagnosis not present

## 2021-12-04 DIAGNOSIS — E1165 Type 2 diabetes mellitus with hyperglycemia: Secondary | ICD-10-CM | POA: Diagnosis not present

## 2021-12-13 DIAGNOSIS — L820 Inflamed seborrheic keratosis: Secondary | ICD-10-CM | POA: Diagnosis not present

## 2022-01-02 DIAGNOSIS — R739 Hyperglycemia, unspecified: Secondary | ICD-10-CM | POA: Diagnosis not present

## 2022-01-02 DIAGNOSIS — E782 Mixed hyperlipidemia: Secondary | ICD-10-CM | POA: Diagnosis not present

## 2022-01-02 DIAGNOSIS — M109 Gout, unspecified: Secondary | ICD-10-CM | POA: Diagnosis not present

## 2022-01-04 DIAGNOSIS — M109 Gout, unspecified: Secondary | ICD-10-CM | POA: Diagnosis not present

## 2022-01-04 DIAGNOSIS — Z789 Other specified health status: Secondary | ICD-10-CM | POA: Diagnosis not present

## 2022-01-04 DIAGNOSIS — G720 Drug-induced myopathy: Secondary | ICD-10-CM | POA: Diagnosis not present

## 2022-01-04 DIAGNOSIS — E876 Hypokalemia: Secondary | ICD-10-CM | POA: Diagnosis not present

## 2022-01-04 DIAGNOSIS — E782 Mixed hyperlipidemia: Secondary | ICD-10-CM | POA: Diagnosis not present

## 2022-01-04 DIAGNOSIS — Z23 Encounter for immunization: Secondary | ICD-10-CM | POA: Diagnosis not present

## 2022-01-04 DIAGNOSIS — E1165 Type 2 diabetes mellitus with hyperglycemia: Secondary | ICD-10-CM | POA: Diagnosis not present

## 2022-01-04 DIAGNOSIS — I10 Essential (primary) hypertension: Secondary | ICD-10-CM | POA: Diagnosis not present

## 2022-01-16 DIAGNOSIS — G4733 Obstructive sleep apnea (adult) (pediatric): Secondary | ICD-10-CM | POA: Diagnosis not present

## 2022-01-29 DIAGNOSIS — B351 Tinea unguium: Secondary | ICD-10-CM | POA: Diagnosis not present

## 2022-01-29 DIAGNOSIS — M79672 Pain in left foot: Secondary | ICD-10-CM | POA: Diagnosis not present

## 2022-01-29 DIAGNOSIS — M79671 Pain in right foot: Secondary | ICD-10-CM | POA: Diagnosis not present

## 2022-01-29 DIAGNOSIS — E119 Type 2 diabetes mellitus without complications: Secondary | ICD-10-CM | POA: Diagnosis not present

## 2022-02-07 NOTE — Patient Instructions (Signed)

## 2022-02-07 NOTE — Progress Notes (Unsigned)
PATIENT: Glenn [name] DOB: 1951/03/01  REASON FOR VISIT: follow up HISTORY FROM: patient  No chief complaint on file.    HISTORY OF PRESENT ILLNESS:  02/07/22 ALL:  Glenn [name] is a 71 y.o. male here today for follow up for OSA on CPAP.      HISTORY: (copied from Dr Guadelupe Sabin previous note)  Mr. Glenn [name] is a 71 year old right-handed gentleman with an underlying medical history of hypertension, gout, edema, diabetes, anxiety and obesity, who presents for follow-up consultation of his obstructive sleep apnea, on treatment with AutoPap therapy.  The patient is accompanied by his wife today and presents for his 1 year checkup.  I first met him at the request of his primary care physician on 12/27/2020, at which time he reported as a prior diagnosis of obstructive sleep apnea.  He had sleep testing through [facility] in 2014.  He had recently received a new AutoPap machine.  He was compliant with treatment and apnea scores looked good, but leak was quite high.  He was advised to get updated supplies and also the correct size for his nasal pillows.  I did not suggest that he have another sleep study at the time.  Today, 02/08/2021: I reviewed his AutoPap compliance data from 01/08/2021 through 02/06/2021, which is a total of 30 days, during which time he used his machine every night with percent use days greater than 4 hours at 100%, indicating superb compliance with an average usage of 7 hours and 25 minutes, residual AHI at goal at 0.6/h, 95th percentile of pressure at 11.6 cm with a range of 10 to 14 cm with EPR, leak acceptable with a 95th percentile at 16.2 L/min, improved compared to last years download data.  He reports doing well.  He has been using P 10 nasal pillows, he is up-to-date with his supplies.  He has no acute concerns.  He had some change in his diabetes management, latest A1c by his report was 7.0 and he was taken off of glipizide.  He has furosemide as needed for  swelling and takes potassium 10 meq if he takes the furosemide.  He has a physical pending for November.  He sees his eye doctor regularly.  He sees dermatology regularly, usually twice a year, he has had basal cell cancers removed from his scalp.   REVIEW OF SYSTEMS: Out of a complete 14 system review of symptoms, the patient complains only of the following symptoms, and all other reviewed systems are negative.  ESS:  ALLERGIES: Allergies  Allergen Reactions   Trulicity [Dulaglutide]     Severe abdominal pain     HOME MEDICATIONS: Outpatient Medications Prior to Visit  Medication Sig Dispense Refill   allopurinol (ZYLOPRIM) 100 MG tablet Take 100 mg by mouth daily.     aspirin EC 81 MG tablet Take 81 mg by mouth daily. Swallow whole.     azelastine (ASTELIN) 0.1 % nasal spray Place 2 sprays into both nostrils 2 (two) times daily.     cholecalciferol (VITAMIN D3) 25 MCG (1000 UNIT) tablet Take 2,000 Units by mouth daily.     cloNIDine (CATAPRES) 0.1 MG tablet Take by mouth.     colchicine 0.6 MG tablet Take by mouth.     nateglinide (STARLIX) 120 MG tablet Take 120 mg by mouth 3 (three) times daily.     spironolactone (ALDACTONE) 25 MG tablet Take 25 mg by mouth 2 (two) times daily.     telmisartan (MICARDIS) 80 MG tablet  Take 80 mg by mouth daily.     traMADol (ULTRAM) 50 MG tablet Take 1 tablet (50 mg total) by mouth every 8 (eight) hours as needed for up to 20 doses. 20 tablet 0   TRESIBA FLEXTOUCH 200 UNIT/ML FlexTouch Pen 40 Units.      triamterene-hydrochlorothiazide (MAXZIDE-25) 37.5-25 MG tablet Take 1 tablet by mouth every morning.     No facility-administered medications prior to visit.    PAST MEDICAL HISTORY: Past Medical History:  Diagnosis Date   Diabetes ([place])    Hypertension    Migraine    OSA (obstructive sleep apnea)     PAST SURGICAL HISTORY: Past Surgical History:  Procedure Laterality Date   CATARACT EXTRACTION     MOHS SURGERY     x2   Quadracept  repair     TONSILLECTOMY      FAMILY HISTORY: Family History  Problem Relation Age of Onset   Bladder Cancer Mother    Kidney failure Father    Prostate cancer Father    Colon cancer Father    Prostate cancer Maternal Grandfather    Prostate cancer Paternal Grandfather     SOCIAL HISTORY: Social History   Socioeconomic History   Marital status: Married    Spouse name: Not on file   Number of children: Not on file   Years of education: Not on file   Highest education level: Not on file  Occupational History   Not on file  Tobacco Use   Smoking status: Never   Smokeless tobacco: Never  Vaping Use   Vaping Use: Never used  Substance and Sexual Activity   Alcohol use: Never   Drug use: Never   Sexual activity: Not on file  Other Topics Concern   Not on file  Social History Narrative   Not on file   Social Determinants of Health   Financial Resource Strain: Not on file  Food Insecurity: No Food Insecurity (02/18/2020)   Hunger Vital Sign    Worried About Running Out of Food in the Last Year: Never true    Ran Out of Food in the Last Year: Never true  Transportation Needs: No Transportation Needs (02/18/2020)   PRAPARE - Hydrologist (Medical): No    Lack of Transportation (Non-Medical): No  Physical Activity: Not on file  Stress: Not on file  Social Connections: Not on file  Intimate Partner Violence: Not on file     PHYSICAL EXAM  There were no vitals filed for this visit. There is no height or weight on file to calculate BMI.  Generalized: Well developed, in no acute distress  Cardiology: normal rate and rhythm, no murmur noted Respiratory: clear to auscultation bilaterally  Neurological examination  Mentation: Alert oriented to time, place, history taking. Follows all commands speech and language fluent Cranial nerve II-XII: Pupils were equal round reactive to light. Extraocular movements were full, visual field were full   Motor: The motor testing reveals 5 over 5 strength of all 4 extremities. Good symmetric motor tone is noted throughout.  Gait and station: Gait is normal.    DIAGNOSTIC DATA (LABS, IMAGING, TESTING) - I reviewed patient records, labs, notes, testing and imaging myself where available.      No data to display           No results found for: "WBC", "HGB", "HCT", "MCV", "PLT" No results found for: "NA", "K", "CL", "CO2", "GLUCOSE", "BUN", "CREATININE", "CALCIUM", "PROT", "ALBUMIN", "AST", "ALT", "  ALKPHOS", "BILITOT", "GFRNONAA", "GFRAA" No results found for: "CHOL", "HDL", "LDLCALC", "LDLDIRECT", "TRIG", "CHOLHDL" No results found for: "HGBA1C" No results found for: "VITAMINB12" No results found for: "TSH"   ASSESSMENT AND PLAN 71 y.o. year old male  has a past medical history of Diabetes ([place]), Hypertension, Migraine, and OSA (obstructive sleep apnea). here with   No diagnosis found.    Royston Bekele is doing well on CPAP therapy. Compliance report reveals ***. *** was encouraged to continue using CPAP nightly and for greater than 4 hours each night. We will update supply orders as indicated. Risks of untreated sleep apnea review and education materials provided. Healthy lifestyle habits encouraged. *** will follow up in ***, sooner if needed. *** verbalizes understanding and agreement with this plan.    No orders of the defined types were placed in this encounter.    No orders of the defined types were placed in this encounter.     Debbora Presto, FNP-C 02/07/2022, 8:41 AM [facility] [address], [place], [place] [address] [phone]

## 2022-02-08 ENCOUNTER — Ambulatory Visit: Payer: HMO | Admitting: Family Medicine

## 2022-02-08 ENCOUNTER — Telehealth: Payer: Self-pay | Admitting: Neurology

## 2022-02-08 ENCOUNTER — Encounter: Payer: Self-pay | Admitting: Family Medicine

## 2022-02-08 VITALS — BP 147/78 | HR 62 | Ht 72.0 in | Wt 278.0 lb

## 2022-02-08 DIAGNOSIS — Z9989 Dependence on other enabling machines and devices: Secondary | ICD-10-CM | POA: Diagnosis not present

## 2022-02-08 DIAGNOSIS — G4733 Obstructive sleep apnea (adult) (pediatric): Secondary | ICD-10-CM

## 2022-02-08 NOTE — Telephone Encounter (Signed)
Orders sent to [facility] via fax and received confirmation that fax went through

## 2022-03-02 DIAGNOSIS — L565 Disseminated superficial actinic porokeratosis (DSAP): Secondary | ICD-10-CM | POA: Diagnosis not present

## 2022-03-02 DIAGNOSIS — E119 Type 2 diabetes mellitus without complications: Secondary | ICD-10-CM | POA: Diagnosis not present

## 2022-03-02 DIAGNOSIS — M79672 Pain in left foot: Secondary | ICD-10-CM | POA: Diagnosis not present

## 2022-06-11 HISTORY — PX: COLONOSCOPY: SHX174

## 2023-01-15 LAB — COLOGUARD: COLOGUARD: POSITIVE — AB

## 2023-01-31 ENCOUNTER — Ambulatory Visit: Payer: HMO | Admitting: Family Medicine

## 2023-02-12 ENCOUNTER — Encounter: Payer: Self-pay | Admitting: Family Medicine

## 2023-02-12 NOTE — Patient Instructions (Signed)

## 2023-02-12 NOTE — Progress Notes (Signed)
PATIENT: Glenn [name] DOB: 08-20-1950  REASON FOR VISIT: follow up HISTORY FROM: patient  Chief Complaint  Patient presents with   Room 1    Pt is here with his Wife. Pt states that he doesn't have any new complaints or concerns to discuss today.      HISTORY OF PRESENT ILLNESS:  02/13/23 ALL:  Glenn [name] returns for follow up for OSA on CPAP. He continues to do well. He is using CPAP nightly for about 7-8 hours, on average. He denies concerns with machine or supplies. He is using a nasal pillow style mask. He can not tolerate FFM. He is sleeping well. He is followed every 3 months by PCP.      02/08/2022 ALL: Glenn [name] is a 72 y.o. male here today for follow up for OSA on CPAP.  He is doing very well on therapy. He is using CPAP nightly for about 8-9 hours. He feels sleep quality is much better on CPAP and wakes feeling refreshed. He denies concerns with machine or supplies.     HISTORY: (copied from Dr Teofilo Pod previous note)  Glenn [name] is a 72 year old right-handed gentleman with an underlying medical history of hypertension, gout, edema, diabetes, anxiety and obesity, who presents for follow-up consultation of his obstructive sleep apnea, on treatment with AutoPap therapy.  The patient is accompanied by his wife today and presents for his 1 year checkup.  I first met him at the request of his primary care physician on 12/27/2020, at which time he reported as a prior diagnosis of obstructive sleep apnea.  He had sleep testing through [facility] medical in 2014.  He had recently received a new AutoPap machine.  He was compliant with treatment and apnea scores looked good, but leak was quite high.  He was advised to get updated supplies and also the correct size for his nasal pillows.  I did not suggest that he have another sleep study at the time.  Today, 02/08/2021: I reviewed his AutoPap compliance data from 01/08/2021 through 02/06/2021, which is a total of 30 days,  during which time he used his machine every night with percent use days greater than 4 hours at 100%, indicating superb compliance with an average usage of 7 hours and 25 minutes, residual AHI at goal at 0.6/h, 95th percentile of pressure at 11.6 cm with a range of 10 to 14 cm with EPR, leak acceptable with a 95th percentile at 16.2 L/min, improved compared to last years download data.  He reports doing well.  He has been using P 10 nasal pillows, he is up-to-date with his supplies.  He has no acute concerns.  He had some change in his diabetes management, latest A1c by his report was 7.0 and he was taken off of glipizide.  He has furosemide as needed for swelling and takes potassium 10 meq if he takes the furosemide.  He has a physical pending for November.  He sees his eye doctor regularly.  He sees dermatology regularly, usually twice a year, he has had basal cell cancers removed from his scalp.   REVIEW OF SYSTEMS: Out of a complete 14 system review of symptoms, the patient complains only of the following symptoms, none and all other reviewed systems are negative.  ESS: 5/24  ALLERGIES: Allergies  Allergen Reactions   Aleve [Naproxen] Other (See Comments)    Doesn't work   Aspirin Hives   Trulicity [Dulaglutide]     Severe abdominal pain  HOME MEDICATIONS: Outpatient Medications Prior to Visit  Medication Sig Dispense Refill   allopurinol (ZYLOPRIM) 100 MG tablet Take 100 mg by mouth 2 (two) times daily.     azelastine (ASTELIN) 0.1 % nasal spray Place 2 sprays into both nostrils 2 (two) times daily.     cholecalciferol (VITAMIN D3) 25 MCG (1000 UNIT) tablet Take 2,000 Units by mouth daily.     cloNIDine (CATAPRES) 0.1 MG tablet Take by mouth.     colchicine 0.6 MG tablet Take by mouth.     famotidine (PEPCID) 40 MG tablet Take 40 mg by mouth daily as needed.     meloxicam (MOBIC) 15 MG tablet Take 15 mg by mouth daily as needed.     nateglinide (STARLIX) 120 MG tablet Take 120 mg  by mouth 3 (three) times daily.     spironolactone (ALDACTONE) 25 MG tablet Take 25 mg by mouth 3 (three) times daily.     telmisartan (MICARDIS) 80 MG tablet Take 80 mg by mouth daily.     TRESIBA FLEXTOUCH 200 UNIT/ML FlexTouch Pen Inject 56 Units into the skin daily.     triamterene-hydrochlorothiazide (MAXZIDE-25) 37.5-25 MG tablet Take 1 tablet by mouth every morning.     No facility-administered medications prior to visit.    PAST MEDICAL HISTORY: Past Medical History:  Diagnosis Date   Diabetes ([facility])    Hypertension    Migraine    OSA (obstructive sleep apnea)     PAST SURGICAL HISTORY: Past Surgical History:  Procedure Laterality Date   CATARACT EXTRACTION     COLONOSCOPY  2024   MOHS SURGERY     x2   Quadracept repair     TONSILLECTOMY      FAMILY HISTORY: Family History  Problem Relation Age of Onset   Bladder Cancer Mother    Kidney failure Father    Prostate cancer Father    Colon cancer Father    Prostate cancer Maternal Grandfather    Prostate cancer Paternal Grandfather     SOCIAL HISTORY: Social History   Socioeconomic History   Marital status: Married    Spouse name: Not on file   Number of children: Not on file   Years of education: Not on file   Highest education level: Not on file  Occupational History   Not on file  Tobacco Use   Smoking status: Never   Smokeless tobacco: Never  Vaping Use   Vaping status: Never Used  Substance and Sexual Activity   Alcohol use: Never   Drug use: Never   Sexual activity: Not on file  Other Topics Concern   Not on file  Social History Narrative   ** Merged History Encounter **       Social Determinants of Health   Financial Resource Strain: Low Risk  (07/02/2022)   Received from [facility]   Overall Financial Resource Strain (CARDIA)    Difficulty of Paying Living Expenses: Not very hard  Food Insecurity: No Food Insecurity (07/02/2022)   [facility]   Hunger Vital Sign     Worried About Running Out of Food in the Last Year: Never true    Ran Out of Food in the Last Year: Never true  Transportation Needs: No Transportation Needs (07/02/2022)   Received from [facility] - Transportation    Lack of Transportation (Medical): No    Lack of Transportation (Non-Medical): No  Physical Activity: Not on file  Stress: Not on file  Social Connections: Unknown (10/24/2021)   Received from [facility]   Social Network    Social Network: Not on file  Intimate Partner Violence: Unknown (09/15/2021)   Received from [facility]   HITS    Physically Hurt: Not on file    Insult or Talk Down To: Not on file    Threaten Physical Harm: Not on file    Scream or Curse: Not on file     PHYSICAL EXAM  Vitals:   02/13/23 1256  BP: (!) 164/75  Pulse: (!) 59  Weight: 278 lb 8 oz (126.3 kg)  Height: 5\' 11"  (1.803 m)    Body mass index is 38.84 kg/m.  Generalized: Well developed, in no acute distress  Cardiology: normal rate and rhythm, no murmur noted Respiratory: clear to auscultation bilaterally  Neurological examination  Mentation: Alert oriented to time, place, history taking. Follows all commands speech and language fluent Cranial nerve II-XII: Pupils were equal round reactive to light. Extraocular movements were full, visual field were full  Motor: The motor testing reveals 5 over 5 strength of all 4 extremities. Good symmetric motor tone is noted throughout.  Gait and station: Gait is normal.    DIAGNOSTIC DATA (LABS, IMAGING, TESTING) - I reviewed patient records, labs, notes, testing and imaging myself where available.      No data to display           No results found for: "WBC", "HGB", "HCT", "MCV", "PLT" No results found for: "NA", "K", "CL", "CO2", "GLUCOSE", "BUN", "CREATININE", "CALCIUM", "PROT", "ALBUMIN", "AST", "ALT", "ALKPHOS", "BILITOT", "GFRNONAA", "GFRAA" No results found for: "CHOL", "HDL",  "LDLCALC", "LDLDIRECT", "TRIG", "CHOLHDL" No results found for: "HGBA1C" No results found for: "VITAMINB12" No results found for: "TSH"   ASSESSMENT AND PLAN 72 y.o. year old male  has a past medical history of Diabetes (H[facility]), Hypertension, Migraine, and OSA (obstructive sleep apnea). here with     ICD-10-CM   1. OSA on CPAP  G47.33 For home use only DME continuous positive airway pressure (CPAP)      MAXTON COVILL is doing well on CPAP therapy. Compliance report reveals excellent compliance. He was encouraged to continue using CPAP nightly and for greater than 4 hours each night. We will update supply orders as indicated. Risks of untreated sleep apnea review and education materials provided. Healthy lifestyle habits encouraged. He will follow up in 1 year, sooner if needed. He verbalizes understanding and agreement with this plan.    Orders Placed This Encounter  Procedures   For home use only DME continuous positive airway pressure (CPAP)    Supplies    Order Specific Question:   Length of Need    Answer:   Lifetime    Order Specific Question:   Patient has OSA or probable OSA    Answer:   Yes    Order Specific Question:   Is the patient currently using CPAP in the home    Answer:   Yes    Order Specific Question:   Settings    Answer:   Other see comments    Order Specific Question:   CPAP supplies needed    Answer:   Mask, headgear, cushions, filters, heated tubing and water chamber     No orders of the defined types were placed in this encounter.     Shawnie Dapper, FNP-C 02/13/2023, 1:09 PM [facility] [address] [place], [place] [address] [phone]

## 2023-02-13 ENCOUNTER — Ambulatory Visit: Payer: HMO | Admitting: Family Medicine

## 2023-02-13 ENCOUNTER — Encounter: Payer: Self-pay | Admitting: Family Medicine

## 2023-02-13 VITALS — BP 164/75 | HR 59 | Ht 71.0 in | Wt 278.5 lb

## 2023-02-13 DIAGNOSIS — G4733 Obstructive sleep apnea (adult) (pediatric): Secondary | ICD-10-CM

## 2023-10-28 ENCOUNTER — Other Ambulatory Visit: Payer: Self-pay

## 2023-10-28 ENCOUNTER — Emergency Department (HOSPITAL_BASED_OUTPATIENT_CLINIC_OR_DEPARTMENT_OTHER)

## 2023-10-28 ENCOUNTER — Emergency Department (HOSPITAL_BASED_OUTPATIENT_CLINIC_OR_DEPARTMENT_OTHER)
Admission: EM | Admit: 2023-10-28 | Discharge: 2023-10-28 | Disposition: A | Discharge status: Home / Self Care | Attending: Emergency Medicine | Admitting: Emergency Medicine

## 2023-10-28 ENCOUNTER — Encounter (HOSPITAL_BASED_OUTPATIENT_CLINIC_OR_DEPARTMENT_OTHER): Payer: Self-pay | Admitting: Emergency Medicine

## 2023-10-28 DIAGNOSIS — Z85828 Personal history of other malignant neoplasm of skin: Secondary | ICD-10-CM | POA: Diagnosis not present

## 2023-10-28 DIAGNOSIS — E119 Type 2 diabetes mellitus without complications: Secondary | ICD-10-CM | POA: Insufficient documentation

## 2023-10-28 DIAGNOSIS — G4730 Sleep apnea, unspecified: Secondary | ICD-10-CM | POA: Diagnosis not present

## 2023-10-28 DIAGNOSIS — Z79899 Other long term (current) drug therapy: Secondary | ICD-10-CM | POA: Diagnosis not present

## 2023-10-28 DIAGNOSIS — Z794 Long term (current) use of insulin: Secondary | ICD-10-CM | POA: Diagnosis not present

## 2023-10-28 DIAGNOSIS — W06XXXA Fall from bed, initial encounter: Secondary | ICD-10-CM | POA: Diagnosis not present

## 2023-10-28 DIAGNOSIS — I10 Essential (primary) hypertension: Secondary | ICD-10-CM | POA: Insufficient documentation

## 2023-10-28 DIAGNOSIS — R2681 Unsteadiness on feet: Secondary | ICD-10-CM | POA: Diagnosis present

## 2023-10-28 DIAGNOSIS — I447 Left bundle-branch block, unspecified: Secondary | ICD-10-CM | POA: Diagnosis not present

## 2023-10-28 DIAGNOSIS — W19XXXA Unspecified fall, initial encounter: Secondary | ICD-10-CM

## 2023-10-28 LAB — COMPREHENSIVE METABOLIC PANEL WITH GFR
ALT: 11 U/L (ref 0–44)
AST: 20 U/L (ref 15–41)
Albumin: 4 g/dL (ref 3.5–5.0)
Alkaline Phosphatase: 97 U/L (ref 38–126)
Anion gap: 14 (ref 5–15)
BUN: 22 mg/dL (ref 8–23)
CO2: 21 mmol/L — ABNORMAL LOW (ref 22–32)
Calcium: 9.4 mg/dL (ref 8.9–10.3)
Chloride: 102 mmol/L (ref 98–111)
Creatinine, Ser: 0.94 mg/dL (ref 0.61–1.24)
GFR, Estimated: >60 mL/min (ref >60)
Glucose, Bld: 126 mg/dL — ABNORMAL HIGH (ref 70–99)
Potassium: 4.1 mmol/L (ref 3.5–5.1)
Sodium: 137 mmol/L (ref 135–145)
Total Bilirubin: 0.4 mg/dL (ref 0.0–1.2)
Total Protein: 6.8 g/dL (ref 6.5–8.1)

## 2023-10-28 LAB — URINE DRUG SCREEN
Amphetamines: NOT DETECTED
Barbiturates: NOT DETECTED
Benzodiazepines: NOT DETECTED
Cocaine: NOT DETECTED
Fentanyl: NOT DETECTED
Methadone Scn, Ur: NOT DETECTED
Opiates: NOT DETECTED
Tetrahydrocannabinol: NOT DETECTED

## 2023-10-28 LAB — URINALYSIS, ROUTINE W REFLEX MICROSCOPIC
Bilirubin Urine: NEGATIVE
Glucose, UA: NEGATIVE mg/dL
Hgb urine dipstick: NEGATIVE
Ketones, ur: NEGATIVE mg/dL
Leukocytes,Ua: NEGATIVE
Nitrite: NEGATIVE
Protein, ur: NEGATIVE mg/dL
Specific Gravity, Urine: 1.015 (ref 1.005–1.030)
pH: 6.5 (ref 5.0–8.0)

## 2023-10-28 LAB — DIFFERENTIAL
Abs Immature Granulocytes: 0.07 10*3/uL (ref 0.00–0.07)
Basophils Absolute: 0.1 10*3/uL (ref 0.0–0.1)
Basophils Relative: 1 %
Eosinophils Absolute: 0.2 10*3/uL (ref 0.0–0.5)
Eosinophils Relative: 2 %
Immature Granulocytes: 1 %
Lymphocytes Relative: 19 %
Lymphs Abs: 2.1 10*3/uL (ref 0.7–4.0)
Monocytes Absolute: 0.7 10*3/uL (ref 0.1–1.0)
Monocytes Relative: 6 %
Neutro Abs: 7.9 10*3/uL — ABNORMAL HIGH (ref 1.7–7.7)
Neutrophils Relative %: 71 %

## 2023-10-28 LAB — PROTIME-INR
INR: 1 (ref 0.8–1.2)
Prothrombin Time: 13 s (ref 11.4–15.2)

## 2023-10-28 LAB — ETHANOL: Alcohol, Ethyl (B): <15 mg/dL (ref <15)

## 2023-10-28 LAB — CBC
HCT: 39.7 % (ref 39.0–52.0)
Hemoglobin: 13.1 g/dL (ref 13.0–17.0)
MCH: 27 pg (ref 26.0–34.0)
MCHC: 33 g/dL (ref 30.0–36.0)
MCV: 81.7 fL (ref 80.0–100.0)
Platelets: 216 10*3/uL (ref 150–400)
RBC: 4.86 MIL/uL (ref 4.22–5.81)
RDW: 14.6 % (ref 11.5–15.5)
WBC: 11.1 10*3/uL — ABNORMAL HIGH (ref 4.0–10.5)
nRBC: 0 % (ref 0.0–0.2)

## 2023-10-28 LAB — TROPONIN T, HIGH SENSITIVITY
Troponin T High Sensitivity: 26 ng/L — ABNORMAL HIGH (ref <19)
Troponin T High Sensitivity: 24 ng/L — ABNORMAL HIGH (ref <19)

## 2023-10-28 NOTE — ED Triage Notes (Signed)
 Fell 2 in 5 days , fell today getting up from bed , fell .  imbalanced , no dizziness , fell forward . Hx HTN , denies LOC or dizziness prior to fall . Reports he is here to check his " imbalance " . Alert and oriented x 4 , steady gait to triage .

## 2023-10-28 NOTE — ED Provider Notes (Signed)
 Patient turned over awaiting's CT chest x-ray results and delta troponin.  Initial troponin was 20 6 repeat 24 so no signs of any acute event.  The concern was that patient may have had an CVA.  But apparently patient refusing MRI.  Head CT had no acute findings.  And chest x-ray had no active cardiopulmonary disease.  Patient's other labs urinalysis complete metabolic panel and CBC did have a white count of 11 but otherwise normal.  No signs of urinary tract infection urine drug screen negative.  Alcohol  less than 15.  Will readdress with patient to see if they want MRI.   Glenn [place], MD 10/28/23 [phone]

## 2023-10-28 NOTE — ED Notes (Signed)
 D/c paperwork reviewed with pt, including follow up care.  All questions and/or concerns addressed at time of d/c.  No further needs expressed. . Pt verbalized understanding, Ambulatory with family to ED exit, NAD.

## 2023-10-28 NOTE — ED Provider Notes (Signed)
 [facility] Provider Note   CSN: [id number] Arrival date & time: 10/28/23  1206     History  Chief Complaint  Patient presents with   Glenn [name] is a 73 y.o. male.  Pt is a 73 yo male with pmhx significant for sleep apnea on bipap, htn, dm, migraines, gout, and skin cancer.  Pt has fallen twice and is unsure why.  He feels imbalanced and unsteady.  He feels like he is going to keep going forward when he stands up.  He denies any vision or swallowing problems.  He is moving arms/legs well.       Home Medications Prior to Admission medications   Medication Sig Start Date End Date Taking? Authorizing Provider  allopurinol (ZYLOPRIM) 100 MG tablet Take 100 mg by mouth 2 (two) times daily. 10/28/19   [provider]  azelastine (ASTELIN) 0.1 % nasal spray Place 2 sprays into both nostrils 2 (two) times daily. 08/23/19   [provider]  cholecalciferol (VITAMIN D3) 25 MCG (1000 UNIT) tablet Take 2,000 Units by mouth daily.    [provider]  cloNIDine (CATAPRES) 0.1 MG tablet Take by mouth. 12/25/19   [provider]  colchicine 0.6 MG tablet Take by mouth. 12/06/19   [provider]  famotidine (PEPCID) 40 MG tablet Take 40 mg by mouth daily as needed. 11/28/21   [provider]  meloxicam (MOBIC) 15 MG tablet Take 15 mg by mouth daily as needed. 12/11/21   [provider]  nateglinide (STARLIX) 120 MG tablet Take 120 mg by mouth 3 (three) times daily. 11/11/19   [provider]  spironolactone (ALDACTONE) 25 MG tablet Take 25 mg by mouth 3 (three) times daily. 12/12/19   [provider]  telmisartan (MICARDIS) 80 MG tablet Take 80 mg by mouth daily. 12/18/19   [provider]  TRESIBA FLEXTOUCH 200 UNIT/ML FlexTouch Pen Inject 56 Units into the skin daily. 10/23/19   [provider]  triamterene-hydrochlorothiazide (MAXZIDE-25) 37.5-25  MG tablet Take 1 tablet by mouth every morning. 12/18/19   [provider]      Allergies    Aleve [naproxen], Aspirin, and Trulicity [dulaglutide]    Review of Systems   Review of Systems  Neurological:        Feeling of being imbalanced  All other systems reviewed and are negative.   Physical Exam Updated Vital Signs BP (!) 175/62   Pulse (!) 52   Temp 98.1 F (36.7 C)   Resp 17   Wt 129.3 kg   SpO2 96%   BMI 39.75 kg/m  Physical Exam Vitals and nursing note reviewed.  Constitutional:      Appearance: Normal appearance. He is obese.  HENT:     Head: Normocephalic and atraumatic.     Right Ear: External ear normal.     Left Ear: External ear normal.     Nose: Nose normal.     Mouth/Throat:     Mouth: Mucous membranes are moist.     Pharynx: Oropharynx is clear.  Eyes:     Extraocular Movements: Extraocular movements intact.     Conjunctiva/sclera: Conjunctivae normal.     Pupils: Pupils are equal, round, and reactive to light.  Cardiovascular:     Rate and Rhythm: Normal rate and regular rhythm.     Pulses: Normal pulses.     Heart sounds: Normal heart sounds.  Pulmonary:  Effort: Pulmonary effort is normal.     Breath sounds: Normal breath sounds.  Abdominal:     General: Abdomen is flat. Bowel sounds are normal.     Palpations: Abdomen is soft.  Musculoskeletal:        General: Normal range of motion.     Cervical back: Normal range of motion and neck supple.  Skin:    General: Skin is warm.     Capillary Refill: Capillary refill takes less than 2 seconds.  Neurological:     General: No focal deficit present.     Mental Status: He is alert and oriented to person, place, and time.  Psychiatric:        Mood and Affect: Mood normal.        Behavior: Behavior normal.     ED Results / Procedures / Treatments   Labs (all labs ordered are listed, but only abnormal results are displayed) Labs Reviewed  CBC - Abnormal; Notable for the following  components:      Result Value   WBC 11.1 (*)    All other components within normal limits  DIFFERENTIAL - Abnormal; Notable for the following components:   Neutro Abs 7.9 (*)    All other components within normal limits  COMPREHENSIVE METABOLIC PANEL WITH GFR - Abnormal; Notable for the following components:   CO2 21 (*)    Glucose, Bld 126 (*)    All other components within normal limits  TROPONIN T, HIGH SENSITIVITY - Abnormal; Notable for the following components:   Troponin T High Sensitivity 26 (*)    All other components within normal limits  ETHANOL  PROTIME-INR  URINE DRUG SCREEN  URINALYSIS, ROUTINE W REFLEX MICROSCOPIC  TROPONIN T, HIGH SENSITIVITY    EKG EKG Interpretation Date/Time:  Monday Oct 28 2023 13:16:30 EDT Ventricular Rate:  64 PR Interval:  182 QRS Duration:  158 QT Interval:  445 QTC Calculation: 460 R Axis:   -19  Text Interpretation: Sinus rhythm Left bundle branch block No old tracing to compare Confirmed by Sueellen Emery [phone]) on 10/28/2023 2:03:19 PM  Radiology No results found.  Procedures Procedures    Medications Ordered in ED Medications - No data to display  ED Course/ Medical Decision Making/ A&P                                 Medical Decision Making Amount and/or Complexity of Data Reviewed Labs: ordered. Radiology: ordered.   This patient presents to the ED for concern of sense of imbalance, this involves an extensive number of treatment options, and is a complaint that carries with it a high risk of complications and morbidity.  The differential diagnosis includes cva, anemia, electrolyte abn   Co morbidities that complicate the patient evaluation  leep apnea on bipap, htn, dm, migraines, gout, and skin cancer   Additional history obtained:  Additional history obtained from epic chart review External records from outside source obtained and reviewed including son   Lab Tests:  I Ordered, and personally  interpreted labs.  The pertinent results include:  cmp nl, cbc nl, inr 1.0, trop 26, etoh neg, ua nl, uds neg   Imaging Studies ordered:  I ordered imaging studies including ct head and cxr I independently visualized and interpreted imaging which showed  Scans are pending at shift change I agree with the radiologist interpretation   Cardiac Monitoring:  The patient was maintained  on a cardiac monitor.  I personally viewed and interpreted the cardiac monitored which showed an underlying rhythm of:sb   Medicines ordered and prescription drug management:   I have reviewed the patients home medicines and have made adjustments as needed   Test Considered:  mri   Critical Interventions:  ct   Problem List / ED Course:  Falls:  etiology unclear.  He does not want to get a MRI as he is very claustrophobic and can't tolerate it, even an open MRI LBBB with bradycardia:  no old EKG to compare.  Pt said he's never been told he has a LBBB, but has not had an EKG in years.   Reevaluation:  After the interventions noted above, I reevaluated the patient and found that they have :improved   Social Determinants of Health:  Lives at home   Dispostion:  Pending at shift change        Final Clinical Impression(s) / ED Diagnoses Final diagnoses:  LBBB (left bundle branch block)  Fall, initial encounter    Rx / DC Orders ED Discharge Orders          Ordered    Ambulatory referral to Cardiology        10/28/23 1536              Sueellen Emery, MD 10/28/23 1537

## 2023-10-28 NOTE — Discharge Instructions (Addendum)
 CT head chest x-ray and labs without significant abnormalities.  Based on the left bundle branch block which appears to be new would recommend follow-up with your doctor.  Return for any new or worse symptoms.  As we discussed stroke not completely ruled out as the cause of the falls or being off balance.  MRI would be needed to verify that.  We understand that you do not want MRI.

## 2023-10-28 NOTE — ED Notes (Signed)
 Pt transported to imaging.

## 2023-12-17 ENCOUNTER — Ambulatory Visit

## 2024-02-18 NOTE — Progress Notes (Unsigned)
 SABRA

## 2024-02-18 NOTE — Progress Notes (Unsigned)
 PATIENT: Glenn [name] DOB: 02-11-1951  REASON FOR VISIT: follow up HISTORY FROM: patient  No chief complaint on file.    HISTORY OF PRESENT ILLNESS:  02/18/24 ALL:  Glenn [name] returns for follow up for OSA on CPAP.   02/13/2023 ALL:  Glenn [name] returns for follow up for OSA on CPAP. He continues to do well. He is using CPAP nightly for about 7-8 hours, on average. He denies concerns with machine or supplies. He is using a nasal pillow style mask. He can not tolerate FFM. He is sleeping well. He is followed every 3 months by PCP.      02/08/2022 ALL: Glenn [name] is a 73 y.o. male here today for follow up for OSA on CPAP.  He is doing very well on therapy. He is using CPAP nightly for about 8-9 hours. He feels sleep quality is much better on CPAP and wakes feeling refreshed. He denies concerns with machine or supplies.     HISTORY: (copied from Dr Obie previous note)  Mr. Dull is a 73 year old right-handed gentleman with an underlying medical history of hypertension, gout, edema, diabetes, anxiety and obesity, who presents for follow-up consultation of his obstructive sleep apnea, on treatment with AutoPap therapy.  The patient is accompanied by his wife today and presents for his 1 year checkup.  I first met him at the request of his primary care physician on 12/27/2020, at which time he reported as a prior diagnosis of obstructive sleep apnea.  He had sleep testing through [facility] in 2014.  He had recently received a new AutoPap machine.  He was compliant with treatment and apnea scores looked good, but leak was quite high.  He was advised to get updated supplies and also the correct size for his nasal pillows.  I did not suggest that he have another sleep study at the time.  Today, 02/08/2021: I reviewed his AutoPap compliance data from 01/08/2021 through 02/06/2021, which is a total of 30 days, during which time he used his machine every night with percent use  days greater than 4 hours at 100%, indicating superb compliance with an average usage of 7 hours and 25 minutes, residual AHI at goal at 0.6/h, 95th percentile of pressure at 11.6 cm with a range of 10 to 14 cm with EPR, leak acceptable with a 95th percentile at 16.2 L/min, improved compared to last years download data.  He reports doing well.  He has been using P 10 nasal pillows, he is up-to-date with his supplies.  He has no acute concerns.  He had some change in his diabetes management, latest A1c by his report was 7.0 and he was taken off of glipizide.  He has furosemide as needed for swelling and takes potassium 10 meq if he takes the furosemide.  He has a physical pending for November.  He sees his eye doctor regularly.  He sees dermatology regularly, usually twice a year, he has had basal cell cancers removed from his scalp.   REVIEW OF SYSTEMS: Out of a complete 14 system review of symptoms, the patient complains only of the following symptoms, none and all other reviewed systems are negative.  ESS: 5/24  ALLERGIES: Allergies  Allergen Reactions   Aleve [Naproxen] Other (See Comments)    Doesn't work   Aspirin Hives   Trulicity [Dulaglutide]     Severe abdominal pain     HOME MEDICATIONS: Outpatient Medications Prior to Visit  Medication Sig Dispense Refill  allopurinol (ZYLOPRIM) 100 MG tablet Take 100 mg by mouth 2 (two) times daily.     azelastine (ASTELIN) 0.1 % nasal spray Place 2 sprays into both nostrils 2 (two) times daily.     cholecalciferol (VITAMIN D3) 25 MCG (1000 UNIT) tablet Take 2,000 Units by mouth daily.     cloNIDine (CATAPRES) 0.1 MG tablet Take by mouth.     colchicine 0.6 MG tablet Take by mouth.     famotidine (PEPCID) 40 MG tablet Take 40 mg by mouth daily as needed.     meloxicam (MOBIC) 15 MG tablet Take 15 mg by mouth daily as needed.     nateglinide (STARLIX) 120 MG tablet Take 120 mg by mouth 3 (three) times daily.     spironolactone (ALDACTONE) 25  MG tablet Take 25 mg by mouth 3 (three) times daily.     telmisartan (MICARDIS) 80 MG tablet Take 80 mg by mouth daily.     TRESIBA FLEXTOUCH 200 UNIT/ML FlexTouch Pen Inject 56 Units into the skin daily.     triamterene-hydrochlorothiazide (MAXZIDE-25) 37.5-25 MG tablet Take 1 tablet by mouth every morning.     No facility-administered medications prior to visit.    PAST MEDICAL HISTORY: Past Medical History:  Diagnosis Date   Diabetes (HCC)    Hypertension    Migraine    OSA (obstructive sleep apnea)     PAST SURGICAL HISTORY: Past Surgical History:  Procedure Laterality Date   CATARACT EXTRACTION     COLONOSCOPY  2024   MOHS SURGERY     x2   Quadracept repair     TONSILLECTOMY      FAMILY HISTORY: Family History  Problem Relation Age of Onset   Bladder Cancer Mother    Kidney failure Father    Prostate cancer Father    Colon cancer Father    Prostate cancer Maternal Grandfather    Prostate cancer Paternal Grandfather     SOCIAL HISTORY: Social History   Socioeconomic History   Marital status: Married    Spouse name: Not on file   Number of children: Not on file   Years of education: Not on file   Highest education level: Not on file  Occupational History   Not on file  Tobacco Use   Smoking status: Never   Smokeless tobacco: Never  Vaping Use   Vaping status: Never Used  Substance and Sexual Activity   Alcohol  use: Never   Drug use: Never   Sexual activity: Not on file  Other Topics Concern   Not on file  Social History Narrative   ** Merged History Encounter **       Social Drivers of Health   Financial Resource Strain: Low Risk  (10/24/2023)   Received from [facility]   Overall Financial Resource Strain (CARDIA)    Difficulty of Paying Living Expenses: Not very hard  Food Insecurity: No Food Insecurity (10/24/2023)   Received from [facility]   Hunger Vital Sign    Within the past 12 months, you worried that your food would run out  before you got the money to buy more.: Never true    Within the past 12 months, the food you bought just didn't last and you didn't have money to get more.: Never true  Transportation Needs: No Transportation Needs (10/24/2023)   Received from [facility] - Transportation    Lack of Transportation (Medical): No    Lack of Transportation (Non-Medical): No  Physical  Activity: Unknown (05/14/2023)   Received from [facility]   Exercise Vital Sign    On average, how many days per week do you engage in moderate to strenuous exercise (like a brisk walk)?: Patient declined    Minutes of Exercise per Session: Not on file  Stress: No Stress Concern Present (05/14/2023)   Received from [facility] - Occupational Stress Questionnaire    Feeling of Stress : Not at all  Social Connections: Socially Integrated (05/14/2023)   Received from [facility]   Social Network    How would you rate your social network (family, work, friends)?: Good participation with social networks  Intimate Partner Violence: Not At Risk (05/14/2023)   Received from [facility]   HITS    Over the last 12 months how often did your partner physically hurt you?: Never    Over the last 12 months how often did your partner insult you or talk down to you?: Never    Over the last 12 months how often did your partner threaten you with physical harm?: Never    Over the last 12 months how often did your partner scream or curse at you?: Never     PHYSICAL EXAM  There were no vitals filed for this visit.   There is no height or weight on file to calculate BMI.  Generalized: Well developed, in no acute distress  Cardiology: normal rate and rhythm, no murmur noted Respiratory: clear to auscultation bilaterally  Neurological examination  Mentation: Alert oriented to time, place, history taking. Follows all commands speech and language fluent Cranial nerve II-XII: Pupils  were equal round reactive to light. Extraocular movements were full, visual field were full  Motor: The motor testing reveals 5 over 5 strength of all 4 extremities. Good symmetric motor tone is noted throughout.  Gait and station: Gait is normal.    DIAGNOSTIC DATA (LABS, IMAGING, TESTING) - I reviewed patient records, labs, notes, testing and imaging myself where available.      No data to display           Lab Results  Component Value Date   WBC 11.1 (H) 10/28/2023   HGB 13.1 10/28/2023   HCT 39.7 10/28/2023   MCV 81.7 10/28/2023   PLT 216 10/28/2023      Component Value Date/Time   NA 137 10/28/2023 1310   K 4.1 10/28/2023 1310   CL 102 10/28/2023 1310   CO2 21 (L) 10/28/2023 1310   GLUCOSE 126 (H) 10/28/2023 1310   BUN 22 10/28/2023 1310   CREATININE 0.94 10/28/2023 1310   CALCIUM 9.4 10/28/2023 1310   PROT 6.8 10/28/2023 1310   ALBUMIN 4.0 10/28/2023 1310   AST 20 10/28/2023 1310   ALT 11 10/28/2023 1310   ALKPHOS 97 10/28/2023 1310   BILITOT 0.4 10/28/2023 1310   GFRNONAA >60 10/28/2023 1310   No results found for: CHOL, HDL, LDLCALC, LDLDIRECT, TRIG, CHOLHDL No results found for: YHAJ8R No results found for: VITAMINB12 No results found for: TSH   ASSESSMENT AND PLAN 73 y.o. year old male  has a past medical history of Diabetes (HCC), Hypertension, Migraine, and OSA (obstructive sleep apnea). here with   No diagnosis found.   JAVONI LUCKEN is doing well on CPAP therapy. Compliance report reveals excellent compliance. He was encouraged to continue using CPAP nightly and for greater than 4 hours each night. We will update supply orders as indicated. Risks of untreated  sleep apnea review and education materials provided. Healthy lifestyle habits encouraged. He will follow up in 1 year, sooner if needed. He verbalizes understanding and agreement with this plan.    No orders of the defined types were placed in this encounter.    No  orders of the defined types were placed in this encounter.     Greig Forbes, FNP-C 02/18/2024, 4:17 PM [facility] [address] [place], [place] [address] [phone]

## 2024-02-18 NOTE — Patient Instructions (Signed)

## 2024-02-19 ENCOUNTER — Ambulatory Visit: Payer: HMO | Admitting: Family Medicine

## 2024-02-19 ENCOUNTER — Encounter: Payer: Self-pay | Admitting: Family Medicine

## 2024-02-19 VITALS — BP 150/72 | HR 67 | Ht 71.0 in | Wt 280.0 lb

## 2024-02-19 DIAGNOSIS — G4733 Obstructive sleep apnea (adult) (pediatric): Secondary | ICD-10-CM | POA: Diagnosis not present

## 2024-06-24 ENCOUNTER — Telehealth: Payer: Self-pay

## 2024-06-24 NOTE — Telephone Encounter (Signed)
 SABRA

## 2025-02-23 ENCOUNTER — Ambulatory Visit: Admitting: Family Medicine
